# Patient Record
Sex: Male | Born: 1965 | Race: Black or African American | Hispanic: No | Marital: Married | Smoking: Never smoker
Health system: Southern US, Community
[De-identification: ages and names within clinical notes are randomized; demographics above are authoritative.]

## PROBLEM LIST (undated history)

## (undated) DIAGNOSIS — E119 Type 2 diabetes mellitus without complications: Secondary | ICD-10-CM

## (undated) DIAGNOSIS — I10 Essential (primary) hypertension: Secondary | ICD-10-CM

## (undated) DIAGNOSIS — E785 Hyperlipidemia, unspecified: Secondary | ICD-10-CM

## (undated) HISTORY — DX: Hyperlipidemia, unspecified: E78.5

## (undated) HISTORY — DX: Type 2 diabetes mellitus without complications: E11.9

## (undated) HISTORY — DX: Essential (primary) hypertension: I10

---

## 1997-12-11 ENCOUNTER — Emergency Department (HOSPITAL_COMMUNITY): Admission: EM | Admit: 1997-12-11 | Discharge: 1997-12-12 | Payer: Self-pay | Admitting: Emergency Medicine

## 1998-03-25 ENCOUNTER — Emergency Department (HOSPITAL_COMMUNITY): Admission: EM | Admit: 1998-03-25 | Discharge: 1998-03-25 | Payer: Self-pay | Admitting: Emergency Medicine

## 1998-05-28 ENCOUNTER — Emergency Department (HOSPITAL_COMMUNITY): Admission: EM | Admit: 1998-05-28 | Discharge: 1998-05-28 | Payer: Self-pay | Admitting: Emergency Medicine

## 2001-09-03 ENCOUNTER — Emergency Department (HOSPITAL_COMMUNITY): Admission: EM | Admit: 2001-09-03 | Discharge: 2001-09-03 | Payer: Self-pay | Admitting: Emergency Medicine

## 2002-01-11 ENCOUNTER — Emergency Department (HOSPITAL_COMMUNITY): Admission: EM | Admit: 2002-01-11 | Discharge: 2002-01-11 | Payer: Self-pay | Admitting: Emergency Medicine

## 2002-12-15 ENCOUNTER — Emergency Department (HOSPITAL_COMMUNITY): Admission: EM | Admit: 2002-12-15 | Discharge: 2002-12-15 | Payer: Self-pay | Admitting: Emergency Medicine

## 2011-07-26 ENCOUNTER — Emergency Department (HOSPITAL_COMMUNITY)
Admission: EM | Admit: 2011-07-26 | Discharge: 2011-07-26 | Disposition: A | Discharge status: Home / Self Care | Payer: Self-pay | Attending: Emergency Medicine | Admitting: Emergency Medicine

## 2011-07-26 ENCOUNTER — Other Ambulatory Visit: Payer: Self-pay

## 2011-07-26 ENCOUNTER — Encounter (HOSPITAL_COMMUNITY): Payer: Self-pay

## 2011-07-26 DIAGNOSIS — R002 Palpitations: Secondary | ICD-10-CM | POA: Insufficient documentation

## 2011-07-26 DIAGNOSIS — R197 Diarrhea, unspecified: Secondary | ICD-10-CM | POA: Insufficient documentation

## 2011-07-26 DIAGNOSIS — R55 Syncope and collapse: Secondary | ICD-10-CM | POA: Insufficient documentation

## 2011-07-26 DIAGNOSIS — J3489 Other specified disorders of nose and nasal sinuses: Secondary | ICD-10-CM | POA: Insufficient documentation

## 2011-07-26 DIAGNOSIS — R42 Dizziness and giddiness: Secondary | ICD-10-CM | POA: Insufficient documentation

## 2011-07-26 LAB — POCT I-STAT TROPONIN I

## 2011-07-26 LAB — POCT I-STAT, CHEM 8
BUN: 15 mg/dL (ref 6–23)
Creatinine, Ser: 1 mg/dL (ref 0.50–1.35)
Glucose, Bld: 89 mg/dL (ref 70–99)
Potassium: 5.2 mEq/L — ABNORMAL HIGH (ref 3.5–5.1)
Sodium: 139 mEq/L (ref 135–145)

## 2011-07-26 NOTE — Discharge Instructions (Signed)
Near-Syncope °Near-syncope is sudden weakness, dizziness, or feeling like you might pass out (faint). This may occur when getting up after sitting or while standing for a long period of time. Near-syncope can be caused by a drop in blood pressure. This is a common reaction, but it may occur to a greater degree in people taking medicines to control their blood pressure. Fainting often occurs when the blood pressure or pulse is too low to provide enough blood flow to the brain to keep you conscious. Fainting and near-syncope are not usually due to serious medical problems. However, certain people should be more cautious in the event of near-syncope, including elderly patients, patients with diabetes, and patients with a history of heart conditions (especially irregular rhythms).  °CAUSES  °· Drop in blood pressure.  °· Physical pain.  °· Dehydration.  °· Heat exhaustion.  °· Emotional distress.  °· Low blood sugar.  °· Internal bleeding.  °· Heart and circulatory problems.  °· Infections.  °SYMPTOMS  °· Dizziness.  °· Feeling sick to your stomach (nauseous).  °· Nearly fainting.  °· Body numbness.  °· Turning pale.  °· Tunnel vision.  °· Weakness.  °HOME CARE INSTRUCTIONS  °· Lie down right away if you start feeling like you might faint. Breathe deeply and steadily. Wait until all the symptoms have passed. Most of these episodes last only a few minutes. You may feel tired for several hours.  °· Drink enough fluids to keep your urine clear or pale yellow.  °· If you are taking blood pressure or heart medicine, get up slowly, taking several minutes to sit and then stand. This can reduce dizziness that is caused by a drop in blood pressure.  °SEEK IMMEDIATE MEDICAL CARE IF:  °· You have a severe headache.  °· Unusual pain develops in the chest, abdomen, or back.  °· There is bleeding from the mouth or rectum, or you have black or tarry stool.  °· An irregular heartbeat or a very rapid pulse develops.  °· You have  repeated fainting or seizure-like jerking during an episode.  °· You faint when sitting or lying down.  °· You develop confusion.  °· You have difficulty walking.  °· Severe weakness develops.  °· Vision problems develop.  °MAKE SURE YOU:  °· Understand these instructions.  °· Will watch your condition.  °· Will get help right away if you are not doing well or get worse.  °Document Released: 05/15/2005 Document Revised: 01/25/2011 Document Reviewed: 07/01/2010 °[employer]® Patient Information ©2012 [employer]. °

## 2011-07-26 NOTE — ED Notes (Signed)
Per EMS- Patient reported that he became dizzy at 1100 this AM and had soreness of his lower abdomen. Then at 1200 patient reports that he had a BM, felt like his heart was racing and then felt like he was going to pass out, but did not. Patient states he had a similar episode 15 years ago when he had a panic attack.

## 2011-07-26 NOTE — ED Notes (Signed)
NWG:[id number]<ZH> Expected date:07/26/11<BR> Expected time:12:55 PM<BR> Means of arrival:Ambulance<BR> Comments:<BR> Near syncope

## 2011-07-26 NOTE — ED Provider Notes (Signed)
History     CSN: [id number]  Arrival date & time 07/26/11  1304   First MD Initiated Contact with Patient 07/26/11 1324      Chief Complaint  Patient presents with  . Near Syncope    HPI Patient says that he was feeling strange this morning.  He works packing boxes.  He said he had some sudden crampy abdominal pain, and had to to to the bathroom and had some diarrhea.  He says after that he went back to his work, and he was feeling light headed, and nearly passed out.  He felt his heart race with this, but no chest pain or dyspnea.  He did not pass out but had to sit down, and EMS was called.   The patient notes he has been a little under the weather lately, with a cold, but no fevers and chills. He says he has been drinking a lot of soda and doe not drink much water. He says he has not medical problems, and has not seen a doctor in years.  He says his mother has high blood pressure, but no one in the family has heart problems.   History reviewed. No pertinent past medical history.  History reviewed. No pertinent past surgical history.  Family History  Problem Relation Age of Onset  . Hypertension Mother     History  Substance Use Topics  . Smoking status: Never Smoker   . Smokeless tobacco: Never Used  . Alcohol Use: Yes     occasionally      Review of Systems  Constitutional: Negative for fever.  HENT: Positive for rhinorrhea.   Eyes: Negative for visual disturbance.  Respiratory: Negative for shortness of breath.   Cardiovascular: Positive for palpitations. Negative for chest pain.  Gastrointestinal: Positive for diarrhea.  Genitourinary: Negative for dysuria.  Musculoskeletal: Negative for myalgias.  Skin: Negative for rash.  Neurological: Positive for light-headedness. Negative for syncope.  Hematological: Negative for adenopathy.    Allergies  Review of patient's allergies indicates no known allergies.  Home Medications  No current outpatient prescriptions  on file.  BP 151/91  Pulse 71  Temp(Src) 98.5 F (36.9 C) (Oral)  Resp 18  SpO2 100%  Physical Exam  Constitutional: He is oriented to person, place, and time. He appears well-developed and well-nourished. No distress.  HENT:  Head: Normocephalic and atraumatic.  Eyes: EOM are normal. Pupils are equal, round, and reactive to light.  Neck: Normal range of motion. Neck supple. No JVD present. Carotid bruit is not present. No thyromegaly present.  Cardiovascular: Normal rate and regular rhythm.   Pulmonary/Chest: Effort normal and breath sounds normal.  Abdominal: Soft. Bowel sounds are normal. There is no tenderness.  Musculoskeletal: He exhibits no edema.  Neurological: He is alert and oriented to person, place, and time. No cranial nerve deficit.  Skin: Skin is warm and dry. No rash noted.    ED Course  Procedures (including critical care time)  Labs Reviewed  POCT I-STAT, CHEM 8 - Abnormal; Notable for the following:    Potassium 5.2 (*)    All other components within normal limits  POCT I-STAT TROPONIN I   No results found.   1. Vasovagal near syncope       MDM  Feel this was due to Vasovagal and/or dehydration.  Pt advised of diagnosis.   Advised to establish care with a primary doctor due to elevated blood pressure in the ED.  Ardyth Gal, MD 07/26/11 [phone]

## 2011-07-27 NOTE — ED Provider Notes (Signed)
I saw and evaluated the patient, reviewed the resident's note and I agree with the findings and plan.   .Face to face Exam:  General:  Awake HEENT:  Atraumatic Resp:  Normal effort Abd:  Nondistended Neuro:No focal weakness Lymph: No adenopathy   Nelia Shi, MD 07/27/11 [phone]

## 2015-01-16 ENCOUNTER — Emergency Department (HOSPITAL_COMMUNITY)
Admission: EM | Admit: 2015-01-16 | Discharge: 2015-01-17 | Disposition: A | Discharge status: Home / Self Care | Payer: No Typology Code available for payment source | Attending: Emergency Medicine | Admitting: Emergency Medicine

## 2015-01-16 DIAGNOSIS — S40812A Abrasion of left upper arm, initial encounter: Secondary | ICD-10-CM | POA: Diagnosis not present

## 2015-01-16 DIAGNOSIS — Y9389 Activity, other specified: Secondary | ICD-10-CM | POA: Insufficient documentation

## 2015-01-16 DIAGNOSIS — S0101XA Laceration without foreign body of scalp, initial encounter: Secondary | ICD-10-CM | POA: Insufficient documentation

## 2015-01-16 DIAGNOSIS — Y999 Unspecified external cause status: Secondary | ICD-10-CM | POA: Insufficient documentation

## 2015-01-16 DIAGNOSIS — Y92410 Unspecified street and highway as the place of occurrence of the external cause: Secondary | ICD-10-CM | POA: Insufficient documentation

## 2015-01-16 DIAGNOSIS — S01411A Laceration without foreign body of right cheek and temporomandibular area, initial encounter: Secondary | ICD-10-CM | POA: Insufficient documentation

## 2015-01-16 DIAGNOSIS — Z23 Encounter for immunization: Secondary | ICD-10-CM | POA: Diagnosis not present

## 2015-01-16 DIAGNOSIS — S01412A Laceration without foreign body of left cheek and temporomandibular area, initial encounter: Secondary | ICD-10-CM | POA: Insufficient documentation

## 2015-01-16 DIAGNOSIS — S0993XA Unspecified injury of face, initial encounter: Secondary | ICD-10-CM | POA: Diagnosis present

## 2015-01-16 DIAGNOSIS — S0181XA Laceration without foreign body of other part of head, initial encounter: Secondary | ICD-10-CM

## 2015-01-16 MED ORDER — TETANUS-DIPHTH-ACELL PERTUSSIS 5-2.5-18.5 LF-MCG/0.5 IM SUSP
0.5000 mL | Freq: Once | INTRAMUSCULAR | Status: AC
  Administered 2015-01-17: 0.5 mL via INTRAMUSCULAR
  Filled 2015-01-16: qty 0.5

## 2015-01-16 MED ORDER — CEFAZOLIN SODIUM 1-5 GM-% IV SOLN
1.0000 g | Freq: Once | INTRAVENOUS | Status: AC
  Administered 2015-01-17: 1 g via INTRAVENOUS
  Filled 2015-01-16: qty 50
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
## 2015-01-17 ENCOUNTER — Emergency Department (HOSPITAL_COMMUNITY): Payer: No Typology Code available for payment source

## 2015-01-17 ENCOUNTER — Encounter (HOSPITAL_COMMUNITY): Payer: Self-pay | Admitting: *Deleted

## 2015-01-17 DIAGNOSIS — S01412A Laceration without foreign body of left cheek and temporomandibular area, initial encounter: Secondary | ICD-10-CM | POA: Diagnosis not present

## 2015-01-17 LAB — CBC WITH DIFFERENTIAL/PLATELET
Basophils Absolute: 0 10*3/uL (ref 0.0–0.1)
Basophils Relative: 0 % (ref 0–1)
EOS ABS: 0.1 10*3/uL (ref 0.0–0.7)
EOS PCT: 2 % (ref 0–5)
HCT: 42.5 % (ref 39.0–52.0)
Hemoglobin: 14.4 g/dL (ref 13.0–17.0)
LYMPHS ABS: 2.7 10*3/uL (ref 0.7–4.0)
Lymphocytes Relative: 52 % — ABNORMAL HIGH (ref 12–46)
MCH: 28 pg (ref 26.0–34.0)
MCHC: 33.9 g/dL (ref 30.0–36.0)
MCV: 82.7 fL (ref 78.0–100.0)
MONO ABS: 0.5 10*3/uL (ref 0.1–1.0)
MONOS PCT: 8 % (ref 3–12)
Neutro Abs: 2 10*3/uL (ref 1.7–7.7)
Neutrophils Relative %: 38 % — ABNORMAL LOW (ref 43–77)
PLATELETS: 227 10*3/uL (ref 150–400)
RBC: 5.14 MIL/uL (ref 4.22–5.81)
RDW: 14.6 % (ref 11.5–15.5)
WBC: 5.3 10*3/uL (ref 4.0–10.5)

## 2015-01-17 LAB — I-STAT CHEM 8, ED
BUN: 15 mg/dL (ref 6–20)
CALCIUM ION: 1.12 mmol/L (ref 1.12–1.23)
Chloride: 103 mmol/L (ref 101–111)
Creatinine, Ser: 1.2 mg/dL (ref 0.61–1.24)
GLUCOSE: 95 mg/dL (ref 65–99)
HCT: 47 % (ref 39.0–52.0)
HEMOGLOBIN: 16 g/dL (ref 13.0–17.0)
Potassium: 4.1 mmol/L (ref 3.5–5.1)
SODIUM: 140 mmol/L (ref 135–145)
TCO2: 23 mmol/L (ref 0–100)

## 2015-01-17 LAB — RAPID URINE DRUG SCREEN, HOSP PERFORMED
Amphetamines: NOT DETECTED
Barbiturates: NOT DETECTED
Benzodiazepines: NOT DETECTED
COCAINE: NOT DETECTED
OPIATES: NOT DETECTED
Tetrahydrocannabinol: NOT DETECTED

## 2015-01-17 MED ORDER — OXYCODONE-ACETAMINOPHEN 5-325 MG PO TABS
1.0000 | ORAL_TABLET | Freq: Once | ORAL | Status: AC
  Administered 2015-01-17: 1 via ORAL
  Filled 2015-01-17: qty 1

## 2015-01-17 MED ORDER — IOHEXOL 300 MG/ML  SOLN
100.0000 mL | Freq: Once | INTRAMUSCULAR | Status: AC | PRN
  Administered 2015-01-17: 100 mL via INTRAVENOUS

## 2015-01-17 MED ORDER — KETOROLAC TROMETHAMINE 30 MG/ML IJ SOLN
30.0000 mg | Freq: Once | INTRAMUSCULAR | Status: AC
  Administered 2015-01-17: 30 mg via INTRAVENOUS
  Filled 2015-01-17: qty 1

## 2015-01-17 MED ORDER — MELOXICAM 15 MG PO TABS: 15.0000 mg | ORAL_TABLET | Freq: Every day | ORAL | Status: DC

## 2015-01-17 MED ORDER — SODIUM CHLORIDE 0.9 % IV SOLN
Freq: Once | INTRAVENOUS | Status: AC
  Administered 2015-01-17: 01:00:00 via INTRAVENOUS

## 2015-01-17 NOTE — ED Notes (Signed)
EDP at bedside  
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
## 2015-01-17 NOTE — ED Notes (Signed)
Pt taken to CT.

## 2015-01-17 NOTE — Discharge Instructions (Signed)
Facial Laceration °A facial laceration is a cut on the face. These injuries can be painful and cause bleeding. Some cuts may need to be closed with stitches (sutures), skin adhesive strips, or wound glue. Cuts usually heal quickly but can leave a scar. It can take 1-2 years for the scar to go away completely. °HOME CARE  °· Only take medicines as told by your doctor. °· Follow your doctor's instructions for wound care. °For Stitches: °· Keep the cut clean and dry. °· If you have a bandage (dressing), change it at least once a day. Change the bandage if it gets wet or dirty, or as told by your doctor. °· Wash the cut with soap and water 2 times a day. Rinse the cut with water. Pat it dry with a clean towel. °· Put a thin layer of medicated cream on the cut as told by your doctor. °· You may shower after the first 24 hours. Do not soak the cut in water until the stitches are removed. °· Have your stitches removed as told by your doctor. °· Do not wear any makeup until a few days after your stitches are removed. °For Skin Adhesive Strips: °· Keep the cut clean and dry. °· Do not get the strips wet. You may take a bath, but be careful to keep the cut dry. °· If the cut gets wet, pat it dry with a clean towel. °· The strips will fall off on their own. Do not remove the strips that are still stuck to the cut. °For Wound Glue: °· You may shower or take baths. Do not soak or scrub the cut. Do not swim. Avoid heavy sweating until the glue falls off on its own. After a shower or bath, pat the cut dry with a clean towel. °· Do not put medicine or makeup on your cut until the glue falls off. °· If you have a bandage, do not put tape over the glue. °· Avoid lots of sunlight or tanning lamps until the glue falls off. °· The glue will fall off on its own in 5-10 days. Do not pick at the glue. °After Healing: °Put sunscreen on the cut for the first year to reduce your scar. °GET HELP RIGHT AWAY IF:  °· Your cut area gets red,  painful, or puffy (swollen). °· You see a yellowish-white fluid (pus) coming from the cut. °· You have chills or a fever. °MAKE SURE YOU:  °· Understand these instructions. °· Will watch your condition. °· Will get help right away if you are not doing well or get worse. °Document Released: 11/01/2007 Document Revised: 03/05/2013 Document Reviewed: 12/26/2012 °ExitCare® Patient Information ©2015 [employer]. This information is not intended to replace advice given to you by your health care provider. Make sure you discuss any questions you have with your health care provider. ° °

## 2015-01-17 NOTE — ED Notes (Signed)
Wounds to L elbow cleaned with sur cleanse by Yvone Neu, EMT; elbow wrapped

## 2016-02-15 ENCOUNTER — Encounter (HOSPITAL_COMMUNITY): Payer: Self-pay | Admitting: Emergency Medicine

## 2016-02-15 ENCOUNTER — Ambulatory Visit (HOSPITAL_COMMUNITY)
Admission: EM | Admit: 2016-02-15 | Discharge: 2016-02-15 | Disposition: A | Discharge status: Home / Self Care | Payer: BLUE CROSS/BLUE SHIELD | Attending: Internal Medicine | Admitting: Internal Medicine

## 2016-02-15 DIAGNOSIS — T148 Other injury of unspecified body region: Secondary | ICD-10-CM

## 2016-02-15 DIAGNOSIS — W57XXXA Bitten or stung by nonvenomous insect and other nonvenomous arthropods, initial encounter: Secondary | ICD-10-CM

## 2016-02-15 MED ORDER — FAMOTIDINE 40 MG PO TABS: 40.0000 mg | ORAL_TABLET | Freq: Every day | ORAL | 0 refills | Status: DC

## 2016-02-15 MED ORDER — PREDNISONE 50 MG PO TABS: 50.0000 mg | ORAL_TABLET | Freq: Every day | ORAL | 0 refills | Status: DC

## 2016-02-15 NOTE — ED Triage Notes (Signed)
Patient has random bumps to neck and arms, various sizes.  Started Saturday.  Patient reports feeling nauseated.

## 2016-02-15 NOTE — ED Provider Notes (Signed)
[place] [facility]    CSN: [id number] Arrival date & time: 02/15/16  1010  First Provider Contact:  First MD Initiated Contact with Patient 02/15/16 1135        History   Chief Complaint Chief Complaint  Patient presents with  . Insect Bite    HPI Daily Theodore Rodriguez is a 50 y.o. male. presents today with numerous itchy papules, over back of neck, R anterior thigh, bilat arms, after sitting on a family member's couch for a few hours on 9/16.  Scratching keeping him awake at night x 2 nights, a little nausea today.  Feels ok otherwise.  No fever, no malaise.  No other itchy family members.  Owner of couch has a dog who sits on the couch too.     HPI  History reviewed. No pertinent past medical history.  History reviewed. No pertinent surgical history.     Home Medications         Takes no meds regularly  Family History Family History  Problem Relation Age of Onset  . Hypertension Mother     Social History Social History  Substance Use Topics  . Smoking status: Never Smoker  . Smokeless tobacco: Never Used  . Alcohol use Yes     Comment: occasionally     Allergies   Review of patient's allergies indicates no known allergies.   Review of Systems Review of Systems  All other systems reviewed and are negative.    Physical Exam Triage Vital Signs ED Triage Vitals [02/15/16 1108]  Enc Vitals Group     BP (!) 180/114     Pulse Rate 70     Resp 16     Temp 98 F (36.7 C)     Temp Source Oral     SpO2 99 %     Weight      Height    Updated Vital Signs BP (!) 180/114 (BP Location: Left Arm)   Pulse 70   Temp 98 F (36.7 C) (Oral)   Resp 16   SpO2 99%  Physical Exam  Constitutional: He is oriented to person, place, and time. No distress.  Alert, nicely groomed  HENT:  Head: Atraumatic.  Eyes:  Conjugate gaze, no eye redness/drainage  Neck: Neck supple.  Cardiovascular: Normal rate.   Pulmonary/Chest: No respiratory distress.  Abdominal: He  exhibits no distension.  Musculoskeletal: Normal range of motion.  Neurological: He is alert and oriented to person, place, and time.  Skin: Skin is warm and dry.  No cyanosis Numerous 8 mm papules, some slightly red, over back of head/neck, undersurfaces of both upper arms, (by report) anterior right thigh.  No blisters/erosions/crusts.  No facial involvement.    Nursing note and vitals reviewed.    UC Treatments / Results   Procedures Procedures (including critical care time)      None  Initial Impression / Assessment and Plan / UC Course   Differential dx: bed bugs, scabies, flea bites, viral exanthem, (less likely) stevens johnson, urticaria.    Final Clinical Impressions(s) / UC Diagnoses   Final diagnoses:  Insect bites   Recheck for further evaluation if bites/itching are not improving in 5-10 days, or if some bites become blistery/raw or involve your mouth/eyes/genitals or for new fever >100.5.  Prescriptions for prednisone (for itching) and famotidine (for nausea) were sent to the [facility] on [address].   New Prescriptions New Prescriptions   FAMOTIDINE (PEPCID) 40 MG TABLET    Take 1 tablet (  40 mg total) by mouth daily.   PREDNISONE (DELTASONE) 50 MG TABLET    Take 1 tablet (50 mg total) by mouth daily.     Sherlene Shams, MD 02/17/16 [phone]

## 2016-02-15 NOTE — Discharge Instructions (Addendum)
Recheck for further evaluation if bites/itching are not improving in 5-10 days, or if some bites become blistery/raw or involve your mouth/eyes/genitals or for new fever >100.5.  Prescriptions for prednisone (for itching) and famotidine (for nausea) were sent to the [facility] on [address].

## 2017-02-21 ENCOUNTER — Ambulatory Visit: Payer: BLUE CROSS/BLUE SHIELD | Admitting: Podiatry

## 2017-04-25 ENCOUNTER — Other Ambulatory Visit: Payer: Self-pay

## 2017-04-25 ENCOUNTER — Ambulatory Visit (INDEPENDENT_AMBULATORY_CARE_PROVIDER_SITE_OTHER): Payer: BLUE CROSS/BLUE SHIELD | Admitting: Family Medicine

## 2017-04-25 ENCOUNTER — Encounter: Payer: Self-pay | Admitting: Family Medicine

## 2017-04-25 VITALS — BP 184/114 | HR 82 | Temp 98.4°F | Resp 18 | Ht 66.42 in | Wt 150.2 lb

## 2017-04-25 DIAGNOSIS — I10 Essential (primary) hypertension: Secondary | ICD-10-CM

## 2017-04-25 DIAGNOSIS — Z131 Encounter for screening for diabetes mellitus: Secondary | ICD-10-CM | POA: Diagnosis not present

## 2017-04-25 MED ORDER — VALSARTAN-HYDROCHLOROTHIAZIDE 160-25 MG PO TABS: 1.0000 | ORAL_TABLET | Freq: Every day | ORAL | 1 refills | Status: DC

## 2017-04-25 NOTE — Patient Instructions (Addendum)
Start a new medication called Valsartan-hctz in the morning Avoid salty foods and sauces Cut back on alcohol Increase your exercise to 15-20 minutes daily Stop the LOSARTAN that you have at home Follow up in 2 weeks for blood pressure visit  IF you received an x-ray today, you will receive an invoice from [facility]. Please contact [facility] at [phone] with questions or concerns regarding your invoice.   IF you received labwork today, you will receive an invoice from [facility]. Please contact [facility] at [phone] with questions or concerns regarding your invoice.   Our billing staff will not be able to assist you with questions regarding bills from these companies.  You will be contacted with the lab results as soon as they are available. The fastest way to get your results is to activate your My Chart account. Instructions are located on the last page of this paperwork. If you have not heard from [place] regarding the results in 2 weeks, please contact this office.     DASH Eating Plan DASH stands for "Dietary Approaches to Stop Hypertension." The DASH eating plan is a healthy eating plan that has been shown to reduce high blood pressure (hypertension). It may also reduce your risk for type 2 diabetes, heart disease, and stroke. The DASH eating plan may also help with weight loss. What are tips for following this plan? General guidelines  Avoid eating more than 2,300 mg (milligrams) of salt (sodium) a day. If you have hypertension, you may need to reduce your sodium intake to 1,500 mg a day.  Limit alcohol intake to no more than 1 drink a day for nonpregnant women and 2 drinks a day for men. One drink equals 12 oz of beer, 5 oz of wine, or 1 oz of hard liquor.  Work with your health care provider to maintain a healthy body weight or to lose weight. Ask what an ideal weight is for you.  Get at least 30 minutes of exercise that causes your heart to beat faster  (aerobic exercise) most days of the week. Activities may include walking, swimming, or biking.  Work with your health care provider or diet and nutrition specialist (dietitian) to adjust your eating plan to your individual calorie needs. Reading food labels  Check food labels for the amount of sodium per serving. Choose foods with less than 5 percent of the Daily Value of sodium. Generally, foods with less than 300 mg of sodium per serving fit into this eating plan.  To find whole grains, look for the word "whole" as the first word in the ingredient list. Shopping  Buy products labeled as "low-sodium" or "no salt added."  Buy fresh foods. Avoid canned foods and premade or frozen meals. Cooking  Avoid adding salt when cooking. Use salt-free seasonings or herbs instead of table salt or sea salt. Check with your health care provider or pharmacist before using salt substitutes.  Do not fry foods. Cook foods using healthy methods such as baking, boiling, grilling, and broiling instead.  Cook with heart-healthy oils, such as olive, canola, soybean, or sunflower oil. Meal planning   Eat a balanced diet that includes: ? 5 or more servings of fruits and vegetables each day. At each meal, try to fill half of your plate with fruits and vegetables. ? Up to 6-8 servings of whole grains each day. ? Less than 6 oz of lean meat, poultry, or fish each day. A 3-oz serving of meat is about the same size as  a deck of cards. One egg equals 1 oz. ? 2 servings of low-fat dairy each day. ? A serving of nuts, seeds, or beans 5 times each week. ? Heart-healthy fats. Healthy fats called Omega-3 fatty acids are found in foods such as flaxseeds and coldwater fish, like sardines, salmon, and mackerel.  Limit how much you eat of the following: ? Canned or prepackaged foods. ? Food that is high in trans fat, such as fried foods. ? Food that is high in saturated fat, such as fatty meat. ? Sweets, desserts, sugary  drinks, and other foods with added sugar. ? Full-fat dairy products.  Do not salt foods before eating.  Try to eat at least 2 vegetarian meals each week.  Eat more home-cooked food and less restaurant, buffet, and fast food.  When eating at a restaurant, ask that your food be prepared with less salt or no salt, if possible. What foods are recommended? The items listed may not be a complete list. Talk with your dietitian about what dietary choices are best for you. Grains Whole-grain or whole-wheat bread. Whole-grain or whole-wheat pasta. Brown rice. [place]. Bulgur. Whole-grain and low-sodium cereals. Pita bread. Low-fat, low-sodium crackers. Whole-wheat flour tortillas. Vegetables Fresh or frozen vegetables (raw, steamed, roasted, or grilled). Low-sodium or reduced-sodium tomato and vegetable juice. Low-sodium or reduced-sodium tomato sauce and tomato paste. Low-sodium or reduced-sodium canned vegetables. Fruits All fresh, dried, or frozen fruit. Canned fruit in natural juice (without added sugar). Meat and other protein foods Skinless chicken or [place]. Ground chicken or [place]. Pork with fat trimmed off. Fish and seafood. Egg whites. Dried beans, peas, or lentils. Unsalted nuts, nut butters, and seeds. Unsalted canned beans. Lean cuts of beef with fat trimmed off. Low-sodium, lean deli meat. Dairy Low-fat (1%) or fat-free (skim) milk. Fat-free, low-fat, or reduced-fat cheeses. Nonfat, low-sodium ricotta or cottage cheese. Low-fat or nonfat yogurt. Low-fat, low-sodium cheese. Fats and oils Soft margarine without trans fats. Vegetable oil. Low-fat, reduced-fat, or light mayonnaise and salad dressings (reduced-sodium). Canola, safflower, olive, soybean, and sunflower oils. Avocado. Seasoning and other foods Herbs. Spices. Seasoning mixes without salt. Unsalted popcorn and pretzels. Fat-free sweets. What foods are not recommended? The items listed may not be a complete list. Talk  with your dietitian about what dietary choices are best for you. Grains Baked goods made with fat, such as croissants, muffins, or some breads. Dry pasta or rice meal packs. Vegetables Creamed or fried vegetables. Vegetables in a cheese sauce. Regular canned vegetables (not low-sodium or reduced-sodium). Regular canned tomato sauce and paste (not low-sodium or reduced-sodium). Regular tomato and vegetable juice (not low-sodium or reduced-sodium). Angie Fava. Olives. Fruits Canned fruit in a light or heavy syrup. Fried fruit. Fruit in cream or butter sauce. Meat and other protein foods Fatty cuts of meat. Ribs. Fried meat. Berniece Salines. Sausage. Bologna and other processed lunch meats. Salami. Fatback. Hotdogs. Bratwurst. Salted nuts and seeds. Canned beans with added salt. Canned or smoked fish. Whole eggs or egg yolks. Chicken or [place] with skin. Dairy Whole or 2% milk, cream, and half-and-half. Whole or full-fat cream cheese. Whole-fat or sweetened yogurt. Full-fat cheese. Nondairy creamers. Whipped toppings. Processed cheese and cheese spreads. Fats and oils Butter. Stick margarine. Lard. Shortening. Ghee. Bacon fat. Tropical oils, such as coconut, palm kernel, or palm oil. Seasoning and other foods Salted popcorn and pretzels. Onion salt, garlic salt, seasoned salt, table salt, and sea salt. Worcestershire sauce. Tartar sauce. Barbecue sauce. Teriyaki sauce. Soy sauce, including reduced-sodium. Steak sauce.  Canned and packaged gravies. Fish sauce. Oyster sauce. Cocktail sauce. Horseradish that you find on the shelf. Ketchup. Mustard. Meat flavorings and tenderizers. Bouillon cubes. Hot sauce and Tabasco sauce. Premade or packaged marinades. Premade or packaged taco seasonings. Relishes. Regular salad dressings. Where to find more information:  National Heart, Lung, and [place]: https://wilson-eaton.com/  American Heart Association: www.heart.org Summary  The DASH eating plan is a healthy eating plan  that has been shown to reduce high blood pressure (hypertension). It may also reduce your risk for type 2 diabetes, heart disease, and stroke.  With the DASH eating plan, you should limit salt (sodium) intake to 2,300 mg a day. If you have hypertension, you may need to reduce your sodium intake to 1,500 mg a day.  When on the DASH eating plan, aim to eat more fresh fruits and vegetables, whole grains, lean proteins, low-fat dairy, and heart-healthy fats.  Work with your health care provider or diet and nutrition specialist (dietitian) to adjust your eating plan to your individual calorie needs. This information is not intended to replace advice given to you by your health care provider. Make sure you discuss any questions you have with your health care provider. Document Released: 05/04/2011 Document Revised: 05/08/2016 Document Reviewed: 05/08/2016 Elsevier Interactive Patient Education  2017 [employer].

## 2017-04-25 NOTE — Progress Notes (Signed)
Chief Complaint  Patient presents with  . Hypertension  . Dizziness    X 1 day  . Gas    X 1 day    HPI   Hypertension: Patient here for follow-up of elevated blood pressure. He is not exercising and is adherent to low salt diet.  Blood pressure is not well controlled at home. Cardiac symptoms none. Patient denies chest pain, fatigue, irregular heart beat, lower extremity edema and near-syncope.  Cardiovascular risk factors: hypertension and male gender. Use of agents associated with hypertension: none. History of target organ damage: none. BP Readings from Last 3 Encounters:  04/25/17 (!) 184/114  02/15/16 (!) 180/114  01/17/15 119/85    Pt reports that he gets some gas pain that has been moving around today He has not been eating He has not been drinking fluids He just resumed his losartan that he was prescribed 3 months ago by  His PCP at [facility] When he was prescribed 50mg  and increased to a 100mg   He only took the 50mg   He is a nonsmoker He drinks 3-4 beers per day after work   He reports that he is coming here from his close friends funeral who died of a stroke.  He has not eaten since yesterday and has some gas pains.    Past Medical History:  Diagnosis Date  . Hypertension     Current Outpatient Medications  Medication Sig Dispense Refill  . losartan (COZAAR) 50 MG tablet Take 50 mg by mouth daily.    . famotidine (PEPCID) 40 MG tablet Take 1 tablet (40 mg total) by mouth daily. (Patient not taking: Reported on 04/25/2017) 15 tablet 0  . predniSONE (DELTASONE) 50 MG tablet Take 1 tablet (50 mg total) by mouth daily. (Patient not taking: Reported on 04/25/2017) 3 tablet 0  . valsartan-hydrochlorothiazide (DIOVAN HCT) 160-25 MG tablet Take 1 tablet by mouth daily. 30 tablet 1   No current facility-administered medications for this visit.     Allergies: No Known Allergies  History reviewed. No pertinent surgical history.  Social History    Socioeconomic History  . Marital status: Married    Spouse name: None  . Number of children: None  . Years of education: None  . Highest education level: None  Social Needs  . Financial resource strain: None  . Food insecurity - worry: None  . Food insecurity - inability: None  . Transportation needs - medical: None  . Transportation needs - non-medical: None  Occupational History  . None  Tobacco Use  . Smoking status: Never Smoker  . Smokeless tobacco: Never Used  Substance and Sexual Activity  . Alcohol use: Yes    Comment: occasionally  . Drug use: No  . Sexual activity: None  Other Topics Concern  . None  Social History Narrative  . None    Family History  Problem Relation Age of Onset  . Hypertension Mother      ROS Review of Systems See HPI Constitution: No fevers or chills No malaise No diaphoresis Skin: No rash or itching Eyes: no blurry vision, no double vision GU: no dysuria or hematuria Neuro: no dizziness or headaches * all others reviewed and negative   Objective: Vitals:   04/25/17 1631 04/25/17 1725  BP: (!) 191/117 (!) 184/114  Pulse: 82   Resp: 18   Temp: 98.4 F (36.9 C)   TempSrc: Oral   SpO2: 99%   Weight: 150 lb 3.2 oz (68.1 kg)   Height:  5' 6.42" (1.687 m)     Physical Exam  Constitutional: He is oriented to person, place, and time. He appears well-developed and well-nourished.  HENT:  Head: Normocephalic and atraumatic.  Eyes: Conjunctivae and EOM are normal.  Neck: Normal range of motion. No thyromegaly present.  Cardiovascular: Normal rate, regular rhythm and normal heart sounds.  No murmur heard. Pulmonary/Chest: Effort normal and breath sounds normal. No respiratory distress.  Abdominal: Soft. Bowel sounds are normal. He exhibits no distension and no mass. There is no tenderness. There is no guarding.  Neurological: He is alert and oriented to person, place, and time.  Skin: Skin is warm. Capillary refill takes  less than 2 seconds.  Psychiatric: He has a normal mood and affect. His behavior is normal. Judgment and thought content normal.    ECG 04/25/17 LVH criteria as well as LAE No ST elevation MI Compared to 07/26/11 ECG unchanged.   Assessment and Plan Dewell was seen today for hypertension, dizziness and gas.  Diagnoses and all orders for this visit:  Uncontrolled hypertension- bp uncontrolled, increased dose of med from losartan 50mg  to diovan hctz 160-25mg  Will refer for stress testing Close follow up in 2 weeks Currently asymptomatic -     Comprehensive metabolic panel -     Lipid panel -     TSH -     valsartan-hydrochlorothiazide (DIOVAN HCT) 160-25 MG tablet; Take 1 tablet by mouth daily. -     Exercise Tolerance Test; Future  Hypertension, unspecified type -     EKG 12-Lead -     Comprehensive metabolic panel -     Lipid panel -     TSH  Screening for diabetes mellitus -     Hemoglobin A1c   A total of 25 minutes were spent face-to-face with the patient during this encounter and over half of that time was spent on counseling and coordination of care.   [place]

## 2017-04-26 ENCOUNTER — Telehealth: Payer: Self-pay | Admitting: Family Medicine

## 2017-04-26 LAB — COMPREHENSIVE METABOLIC PANEL
A/G RATIO: 1.7 (ref 1.2–2.2)
ALBUMIN: 4.5 g/dL (ref 3.5–5.5)
ALT: 17 IU/L (ref 0–44)
AST: 20 IU/L (ref 0–40)
Alkaline Phosphatase: 69 IU/L (ref 39–117)
BUN / CREAT RATIO: 9 (ref 9–20)
BUN: 9 mg/dL (ref 6–24)
Bilirubin Total: 1.4 mg/dL — ABNORMAL HIGH (ref 0.0–1.2)
CALCIUM: 9.5 mg/dL (ref 8.7–10.2)
CO2: 26 mmol/L (ref 20–29)
CREATININE: 0.99 mg/dL (ref 0.76–1.27)
Chloride: 99 mmol/L (ref 96–106)
GFR, EST AFRICAN AMERICAN: 101 mL/min/{1.73_m2} (ref >59)
GFR, EST NON AFRICAN AMERICAN: 87 mL/min/{1.73_m2} (ref >59)
GLOBULIN, TOTAL: 2.7 g/dL (ref 1.5–4.5)
Glucose: 105 mg/dL — ABNORMAL HIGH (ref 65–99)
POTASSIUM: 4.5 mmol/L (ref 3.5–5.2)
SODIUM: 140 mmol/L (ref 134–144)
Total Protein: 7.2 g/dL (ref 6.0–8.5)

## 2017-04-26 LAB — HEMOGLOBIN A1C
ESTIMATED AVERAGE GLUCOSE: 123 mg/dL
HEMOGLOBIN A1C: 5.9 % — AB (ref 4.8–5.6)

## 2017-04-26 LAB — LIPID PANEL
CHOL/HDL RATIO: 2.3 ratio (ref 0.0–5.0)
Cholesterol, Total: 175 mg/dL (ref 100–199)
HDL: 75 mg/dL (ref >39)
LDL CALC: 89 mg/dL (ref 0–99)
Triglycerides: 55 mg/dL (ref 0–149)
VLDL Cholesterol Cal: 11 mg/dL (ref 5–40)

## 2017-04-26 LAB — TSH: TSH: 0.708 u[IU]/mL (ref 0.450–4.500)

## 2017-04-26 NOTE — Telephone Encounter (Signed)
Copied from [place] [id number]. Topic: Quick Communication - See Telephone Encounter >> Apr 26, 2017  9:34 AM Theodore Rodriguez B wrote: CRM for notification. See Telephone encounter for:  Asking if he should take 1 or two blood pressure pills  pt was prescribed med yesterday  - valsarten hydrochlorothiazide 04/26/17. Pt states his bp is 147/96

## 2017-04-26 NOTE — Telephone Encounter (Signed)
LMOVM.  Stressed pt is to take only Valsartan-HCTZ in the am,  Should NOT take the Losartan. Limit salt, sauces,  Limit beers to 1-2 daily and walk 15-20 min daily-park at far end of parking lot when he goes somewhere and that will assist with his 15-20 min per day walking.

## 2017-05-08 NOTE — Progress Notes (Signed)
Chief Complaint  Patient presents with  . Follow-up    hypertension    HPI   Essential Hypertension  Hypertension: Patient here for follow-up of elevated blood pressure.  He was given Diovan Hctz 160-25mg  at his last visit on 04/25/2017.  He was started on the new dose to help control his bp and sent for stress test but the stress test was not appropriately ordered.     He is exercising and is trying adhere to low salt diet.   Blood pressure is well controlled at home. He has cut down on his alcohol consumption and is now only consuming 1-2 beers. Cardiac symptoms none.  Patient denies chest pain, chest pressure/discomfort, claudication, exertional chest pressure/discomfort, irregular heart beat, lower extremity edema and palpitations.   Cardiovascular risk factors: hypertension, male gender and sedentary lifestyle.  Use of agents associated with hypertension: none.  History of target organ damage: none. BP Readings from Last 3 Encounters:  05/09/17 130/74  04/25/17 (!) 184/114  02/15/16 (!) 180/114    Wt Readings from Last 3 Encounters:  05/09/17 150 lb 6.4 oz (68.2 kg)  04/25/17 150 lb 3.2 oz (68.1 kg)  01/17/15 155 lb (70.3 kg)   The 10-year ASCVD risk score Mikey Bussing DC Jr., et al., 2013) is: 8.5%   Values used to calculate the score:     Age: 51 years     Sex: Male     Is Non-Hispanic African American: Yes     Diabetic: No     Tobacco smoker: No     Systolic Blood Pressure: 086 mmHg     Is BP treated: Yes     HDL Cholesterol: 75 mg/dL     Total Cholesterol: 175 mg/dL  Prediabetes Pt reports that he has been having a better diet and eating less fast food He has cut back on alcohol Lab Results  Component Value Date   HGBA1C 5.9 (H) 04/25/2017   There is diabetes in his family in his mother and her side of the family. He denies polyuria, polydipsia and polyphagia.   Past Medical History:  Diagnosis Date  . Hypertension     Current Outpatient Medications    Medication Sig Dispense Refill  . valsartan-hydrochlorothiazide (DIOVAN HCT) 160-25 MG tablet Take 1 tablet by mouth daily. 90 tablet 1   No current facility-administered medications for this visit.     Allergies: No Known Allergies  No past surgical history on file.  Social History   Socioeconomic History  . Marital status: Married    Spouse name: None  . Number of children: None  . Years of education: None  . Highest education level: None  Social Needs  . Financial resource strain: None  . Food insecurity - worry: None  . Food insecurity - inability: None  . Transportation needs - medical: None  . Transportation needs - non-medical: None  Occupational History  . None  Tobacco Use  . Smoking status: Never Smoker  . Smokeless tobacco: Never Used  Substance and Sexual Activity  . Alcohol use: Yes    Comment: occasionally  . Drug use: No  . Sexual activity: None  Other Topics Concern  . None  Social History Narrative  . None    Family History  Problem Relation Age of Onset  . Hypertension Mother      ROS Review of Systems See HPI Constitution: No fevers or chills No malaise No diaphoresis Skin: No rash or itching Eyes: no blurry vision, no double vision  GU: no dysuria or hematuria Neuro: no dizziness or headaches all others reviewed and negative   Objective: Vitals:   05/09/17 1040  BP: 130/74  Pulse: 94  Resp: 16  Temp: 98 F (36.7 C)  TempSrc: Oral  SpO2: 96%  Weight: 150 lb 6.4 oz (68.2 kg)  Height: 5' 6.42" (1.687 m)    Physical Exam  Physical Exam  Constitutional: She is oriented to person, place, and time. She appears well-developed and well-nourished.  HENT:  Head: Normocephalic and atraumatic.  Eyes: Conjunctivae and EOM are normal.  Cardiovascular: Normal rate, regular rhythm and normal heart sounds.   Pulmonary/Chest: Effort normal and breath sounds normal. No respiratory distress. She has no wheezes.  Abdominal: Normal  appearance and bowel sounds are normal. There is no tenderness. There is no CVA tenderness.  Neurological: he is alert and oriented to person, place, and time.    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 Referred for stress testing      Relevant Orders   ECHOCARDIOGRAM STRESS TEST   Prediabetes    Discussed that he could benefit from dietary changes.  If his a1c increases to greater than 6% would recommend low dose metformin        Other Visit Diagnoses    Need for influenza vaccination       Relevant Orders   Flu Vaccine QUAD 36+ mos IM (Completed)   Special screening for malignant neoplasms, colon       Relevant Orders   Ambulatory referral to Gastroenterology   Uncontrolled hypertension       Relevant Medications   valsartan-hydrochlorothiazide (DIOVAN HCT) 160-25 MG tablet      Discussed colon cancer screening  Referral placed today  REVIEWED ALL LABS WITH PATIENT   A total of 25 minutes were spent face-to-face with the patient during this encounter and over half of that time was spent on counseling and coordination of care.   [place]

## 2017-05-09 ENCOUNTER — Other Ambulatory Visit: Payer: Self-pay

## 2017-05-09 ENCOUNTER — Ambulatory Visit (INDEPENDENT_AMBULATORY_CARE_PROVIDER_SITE_OTHER): Payer: BLUE CROSS/BLUE SHIELD | Admitting: Family Medicine

## 2017-05-09 ENCOUNTER — Encounter: Payer: Self-pay | Admitting: Family Medicine

## 2017-05-09 VITALS — BP 130/74 | HR 94 | Temp 98.0°F | Resp 16 | Ht 66.42 in | Wt 150.4 lb

## 2017-05-09 DIAGNOSIS — Z1211 Encounter for screening for malignant neoplasm of colon: Secondary | ICD-10-CM

## 2017-05-09 DIAGNOSIS — R9431 Abnormal electrocardiogram [ECG] [EKG]: Secondary | ICD-10-CM

## 2017-05-09 DIAGNOSIS — R7303 Prediabetes: Secondary | ICD-10-CM | POA: Diagnosis not present

## 2017-05-09 DIAGNOSIS — I10 Essential (primary) hypertension: Secondary | ICD-10-CM | POA: Diagnosis not present

## 2017-05-09 DIAGNOSIS — Z23 Encounter for immunization: Secondary | ICD-10-CM

## 2017-05-09 MED ORDER — VALSARTAN-HYDROCHLOROTHIAZIDE 160-25 MG PO TABS: 1.0000 | ORAL_TABLET | Freq: Every day | ORAL | 1 refills | Status: DC

## 2017-05-09 NOTE — Patient Instructions (Addendum)
   IF you received an x-ray today, you will receive an invoice from [facility]. Please contact [facility] at [phone] with questions or concerns regarding your invoice.   IF you received labwork today, you will receive an invoice from [facility]. Please contact [facility] at [phone] with questions or concerns regarding your invoice.   Our billing staff will not be able to assist you with questions regarding bills from these companies.  You will be contacted with the lab results as soon as they are available. The fastest way to get your results is to activate your My Chart account. Instructions are located on the last page of this paperwork. If you have not heard from us regarding the results in 2 weeks, please contact this office.    Prediabetes Eating Plan Prediabetes-also called impaired glucose tolerance or impaired fasting glucose-is a condition that causes blood sugar (blood glucose) levels to be higher than normal. Following a healthy diet can help to keep prediabetes under control. It can also help to lower the risk of type 2 diabetes and heart disease, which are increased in people who have prediabetes. Along with regular exercise, a healthy diet:  Promotes weight loss.  Helps to control blood sugar levels.  Helps to improve the way that the body uses insulin.  What do I need to know about this eating plan?  Use the glycemic index (GI) to plan your meals. The index tells you how quickly a food will raise your blood sugar. Choose low-GI foods. These foods take a longer time to raise blood sugar.  Pay close attention to the amount of carbohydrates in the food that you eat. Carbohydrates increase blood sugar levels.  Keep track of how many calories you take in. Eating the right amount of calories will help you to achieve a healthy weight. Losing about 7 percent of your starting weight can help to prevent type 2 diabetes.  You may want to follow a  Mediterranean diet. This diet includes a lot of vegetables, lean meats or fish, whole grains, fruits, and healthy oils and fats. What foods can I eat? Grains Whole grains, such as whole-wheat or whole-grain breads, crackers, cereals, and pasta. Unsweetened oatmeal. Bulgur. Barley. Quinoa. Brown rice. Corn or whole-wheat flour tortillas or taco shells. Vegetables Lettuce. Spinach. Peas. Beets. Cauliflower. Cabbage. Broccoli. Carrots. Tomatoes. Squash. Eggplant. Herbs. Peppers. Onions. Cucumbers. Brussels sprouts. Fruits Berries. Bananas. Apples. Oranges. Grapes. Papaya. Mango. Pomegranate. Kiwi. Grapefruit. Cherries. Meats and Other Protein Sources Seafood. Lean meats, such as chicken and turkey or lean cuts of pork and beef. Tofu. Eggs. Nuts. Beans. Dairy Low-fat or fat-free dairy products, such as yogurt, cottage cheese, and cheese. Beverages Water. Tea. Coffee. Sugar-free or diet soda. Seltzer water. Milk. Milk alternatives, such as soy or almond milk. Condiments Mustard. Relish. Low-fat, low-sugar ketchup. Low-fat, low-sugar barbecue sauce. Low-fat or fat-free mayonnaise. Sweets and Desserts Sugar-free or low-fat pudding. Sugar-free or low-fat ice cream and other frozen treats. Fats and Oils Avocado. Walnuts. Olive oil. The items listed above may not be a complete list of recommended foods or beverages. Contact your dietitian for more options. What foods are not recommended? Grains Refined white flour and flour products, such as bread, pasta, snack foods, and cereals. Beverages Sweetened drinks, such as sweet iced tea and soda. Sweets and Desserts Baked goods, such as cake, cupcakes, pastries, cookies, and cheesecake. The items listed above may not be a complete list of foods and beverages to avoid. Contact your dietitian for more information.   This information is not intended to replace advice given to you by your health care provider. Make sure you discuss any questions you have with  your health care provider. Document Released: 09/29/2014 Document Revised: 10/21/2015 Document Reviewed: 06/10/2014 Elsevier Interactive Patient Education  2017 Elsevier Inc.  

## 2017-05-09 NOTE — Assessment & Plan Note (Signed)
Pt bp improved with increased med with diovan-hct 160-25 Also lifestyle modification

## 2017-05-09 NOTE — Addendum Note (Signed)
Addended by: Delia Chimes A on: 05/09/2017 10:52 AM   Modules accepted: Orders

## 2017-05-09 NOTE — Assessment & Plan Note (Signed)
Discussed that he could benefit from dietary changes.  If his a1c increases to greater than 6% would recommend low dose metformin

## 2017-05-09 NOTE — Assessment & Plan Note (Signed)
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 Referred for stress testing

## 2017-05-20 ENCOUNTER — Encounter (HOSPITAL_COMMUNITY): Payer: Self-pay

## 2017-05-20 ENCOUNTER — Emergency Department (HOSPITAL_COMMUNITY)
Admission: EM | Admit: 2017-05-20 | Discharge: 2017-05-20 | Disposition: A | Discharge status: Home / Self Care | Payer: BLUE CROSS/BLUE SHIELD | Attending: Emergency Medicine | Admitting: Emergency Medicine

## 2017-05-20 ENCOUNTER — Emergency Department (HOSPITAL_COMMUNITY): Payer: BLUE CROSS/BLUE SHIELD

## 2017-05-20 DIAGNOSIS — R202 Paresthesia of skin: Secondary | ICD-10-CM

## 2017-05-20 DIAGNOSIS — R200 Anesthesia of skin: Secondary | ICD-10-CM | POA: Insufficient documentation

## 2017-05-20 DIAGNOSIS — I10 Essential (primary) hypertension: Secondary | ICD-10-CM | POA: Insufficient documentation

## 2017-05-20 DIAGNOSIS — F410 Panic disorder [episodic paroxysmal anxiety] without agoraphobia: Secondary | ICD-10-CM

## 2017-05-20 LAB — I-STAT CHEM 8, ED
BUN: 25 mg/dL — AB (ref 6–20)
Calcium, Ion: 1.12 mmol/L — ABNORMAL LOW (ref 1.15–1.40)
Chloride: 100 mmol/L — ABNORMAL LOW (ref 101–111)
Creatinine, Ser: 1.1 mg/dL (ref 0.61–1.24)
Glucose, Bld: 142 mg/dL — ABNORMAL HIGH (ref 65–99)
HEMATOCRIT: 50 % (ref 39.0–52.0)
HEMOGLOBIN: 17 g/dL (ref 13.0–17.0)
Potassium: 3.7 mmol/L (ref 3.5–5.1)
SODIUM: 141 mmol/L (ref 135–145)
TCO2: 30 mmol/L (ref 22–32)

## 2017-05-20 LAB — DIFFERENTIAL
Basophils Absolute: 0 10*3/uL (ref 0.0–0.1)
Basophils Relative: 0 %
EOS ABS: 0.1 10*3/uL (ref 0.0–0.7)
EOS PCT: 1 %
LYMPHS ABS: 2.9 10*3/uL (ref 0.7–4.0)
Lymphocytes Relative: 58 %
Monocytes Absolute: 0.4 10*3/uL (ref 0.1–1.0)
Monocytes Relative: 7 %
NEUTROS PCT: 34 %
Neutro Abs: 1.7 10*3/uL (ref 1.7–7.7)

## 2017-05-20 LAB — CBC
HCT: 47.4 % (ref 39.0–52.0)
Hemoglobin: 15.6 g/dL (ref 13.0–17.0)
MCH: 27.8 pg (ref 26.0–34.0)
MCHC: 32.9 g/dL (ref 30.0–36.0)
MCV: 84.5 fL (ref 78.0–100.0)
PLATELETS: 202 10*3/uL (ref 150–400)
RBC: 5.61 MIL/uL (ref 4.22–5.81)
RDW: 14.5 % (ref 11.5–15.5)
WBC: 4.9 10*3/uL (ref 4.0–10.5)

## 2017-05-20 LAB — APTT: aPTT: 35 seconds (ref 24–36)

## 2017-05-20 LAB — COMPREHENSIVE METABOLIC PANEL
ALBUMIN: 4 g/dL (ref 3.5–5.0)
ALT: 28 U/L (ref 17–63)
ANION GAP: 9 (ref 5–15)
AST: 32 U/L (ref 15–41)
Alkaline Phosphatase: 87 U/L (ref 38–126)
BILIRUBIN TOTAL: 1.1 mg/dL (ref 0.3–1.2)
BUN: 21 mg/dL — ABNORMAL HIGH (ref 6–20)
CO2: 27 mmol/L (ref 22–32)
Calcium: 9.1 mg/dL (ref 8.9–10.3)
Chloride: 102 mmol/L (ref 101–111)
Creatinine, Ser: 1.28 mg/dL — ABNORMAL HIGH (ref 0.61–1.24)
GFR calc non Af Amer: >60 mL/min (ref >60)
GLUCOSE: 144 mg/dL — AB (ref 65–99)
POTASSIUM: 3.8 mmol/L (ref 3.5–5.1)
SODIUM: 138 mmol/L (ref 135–145)
TOTAL PROTEIN: 7.1 g/dL (ref 6.5–8.1)

## 2017-05-20 LAB — PROTIME-INR
INR: 0.88
PROTHROMBIN TIME: 11.9 s (ref 11.4–15.2)

## 2017-05-20 LAB — I-STAT TROPONIN, ED: Troponin i, poc: 0 ng/mL (ref 0.00–0.08)

## 2017-05-20 MED ORDER — HYDROXYZINE HCL 25 MG PO TABS: 25.0000 mg | ORAL_TABLET | Freq: Four times a day (QID) | ORAL | 0 refills | Status: DC | PRN

## 2017-05-20 NOTE — ED Notes (Signed)
Pt has returned from CT.  

## 2017-05-20 NOTE — ED Provider Notes (Signed)
[facility] EMERGENC[facility] DEPART[facility] Provider Note   CSN: [id number] Arrival date & time: 05/20/17  4098     History   Chief Complaint Chief Complaint  Patient presents with  . Numbness    HPI Theodore Rodriguez is a 51 y.o. male.  The history is provided by the patient and the spouse. No language interpreter was used.    Theodore Rodriguez is a 51 y.o. male who presents to the Emergency Department complaining of numbness.  He was in his routine state of health when he went to bed last night.  When he awoke around 6 AM this morning he felt some bubbling in his stomach and developed some associated shortness of breath and tingling throughout his face and bilateral hands with sweating in bilateral feet.  He states his head felt a little funny at the time.  Overall his symptoms have completely resolved in the emergency department.  He denies any associated headache, chest pain, abdominal pain, vomiting, diarrhea, weakness.  He does have a history of hypertension and drinks alcohol regularly.  He denies any recent stress but his wife reveals that he has lost several friends recently that are the same age as him.  He also endorses working long and irregular hours and poor sleep.  He had a similar episode about a month ago and was evaluated in another emergency department with no clear explanation for his symptoms.  Past Medical History:  Diagnosis Date  . Hypertension     Patient Active Problem List   Diagnosis Date Noted  . Essential hypertension 05/09/2017  . Nonspecific abnormal electrocardiogram (ECG) (EKG) 05/09/2017  . Prediabetes 05/09/2017    History reviewed. No pertinent surgical history.     Home Medications    Prior to Admission medications   Medication Sig Start Date End Date Taking? Authorizing Provider  valsartan-hydrochlorothiazide (DIOVAN HCT) 160-25 MG tablet Take 1 tablet by mouth daily. 05/09/17   Forrest Moron, MD    Family History Family History    Problem Relation Age of Onset  . Hypertension Mother     Social History Social History   Tobacco Use  . Smoking status: Never Smoker  . Smokeless tobacco: Never Used  Substance Use Topics  . Alcohol use: Yes    Comment: occasionally  . Drug use: No     Allergies   Patient has no known allergies.   Review of Systems Review of Systems  All other systems reviewed and are negative.    Physical Exam Updated Vital Signs BP (!) 158/103   Pulse 68   Temp 98.1 F (36.7 C) (Oral)   Resp 16   SpO2 98%   Physical Exam  Constitutional: He is oriented to person, place, and time. He appears well-developed and well-nourished.  HENT:  Head: Normocephalic and atraumatic.  Eyes: EOM are normal. Pupils are equal, round, and reactive to light.  Cardiovascular: Normal rate and regular rhythm.  No murmur heard. Pulmonary/Chest: Effort normal and breath sounds normal. No respiratory distress.  Abdominal: Soft. There is no tenderness. There is no rebound and no guarding.  Musculoskeletal: He exhibits no edema or tenderness.  Neurological: He is alert and oriented to person, place, and time. No cranial nerve deficit or sensory deficit. Coordination normal.  No pronator drift.  5 out of 5 strength in all 4 extremities.  Skin: Skin is warm and dry.  Psychiatric: He has a normal mood and affect. His behavior is normal.  Nursing note and vitals reviewed.  ED Treatments / Results  Labs (all labs ordered are listed, but only abnormal results are displayed) Labs Reviewed  COMPREHENSIVE METABOLIC PANEL - Abnormal; Notable for the following components:      Result Value   Glucose, Bld 144 (*)    BUN 21 (*)    Creatinine, Ser 1.28 (*)    All other components within normal limits  I-STAT CHEM 8, ED - Abnormal; Notable for the following components:   Chloride 100 (*)    BUN 25 (*)    Glucose, Bld 142 (*)    Calcium, Ion 1.12 (*)    All other components within normal limits   PROTIME-INR  APTT  CBC  DIFFERENTIAL  I-STAT TROPONIN, ED  CBG MONITORING, ED    EKG  EKG Interpretation  Date/Time:  Sunday May 20 2017 06:53:00 EST Ventricular Rate:  87 PR Interval:  178 QRS Duration: 78 QT Interval:  364 QTC Calculation: 438 R Axis:   59 Text Interpretation:  Sinus rhythm with marked sinus arrhythmia Otherwise normal ECG Confirmed by Quintella Reichert [phone]) on 05/20/2017 9:03:03 AM       Radiology Ct Head Wo Contrast  Result Date: 05/20/2017 CLINICAL DATA:  Woke up with tingling in face in both arms. EXAM: CT HEAD WITHOUT CONTRAST TECHNIQUE: Contiguous axial images were obtained from the base of the skull through the vertex without intravenous contrast. COMPARISON:  01/17/2015 FINDINGS: Brain: No evidence of acute infarction, hemorrhage, hydrocephalus, extra-axial collection or mass effect. Cavum velum interpositum cyst incidentally noted. Vascular: No hyperdense vessel or unexpected calcification. Skull: Normal. Negative for fracture or focal lesion. Sinuses/Orbits: No acute finding. IMPRESSION: No acute finding or explanation for symptoms. Electronically Signed   By: Monte Fantasia M.D.   On: 05/20/2017 07:43    Procedures Procedures (including critical care time)  Medications Ordered in ED Medications - No data to display   Initial Impression / Assessment and Plan / ED Course  I have reviewed the triage vital signs and the nursing notes.  Pertinent labs & imaging results that were available during my care of the patient were reviewed by me and considered in my medical decision making (see chart for details).     Patient here for evaluation of tingling, funny feeling in his head as well as some nausea/gas sensation.  His symptoms have completely resolved in the emergency department and he has a normal neurologic exam he is mildly anxious in the emergency department.  Presentation is not consistent with CVA, subarachnoid hemorrhage, ACS, PE,  TIA, hypertensive urgency.  There is concerned that patient is having anxiety with occasional panic attacks.  Counseled patient on alcohol use and abuse as well as stress and anxiety.  Discussed importance of PCP follow-up as well as return precautions.  Final Clinical Impressions(s) / ED Diagnoses   Final diagnoses:  None    ED Discharge Orders    None       Quintella Reichert, MD 05/20/17 [phone]

## 2017-05-20 NOTE — ED Notes (Signed)
Pt to CT at this time.

## 2017-05-20 NOTE — ED Triage Notes (Signed)
Pt states that went to bed around 1am LSN. Woke up feeling like he had gas about an hour ago.  C/o of tingling in his face, both sides, and his arms. Neuro intact. Pt states that he feels his speech is a little different

## 2017-05-23 ENCOUNTER — Telehealth (HOSPITAL_COMMUNITY): Payer: Self-pay | Admitting: Family Medicine

## 2017-05-24 ENCOUNTER — Telehealth: Payer: Self-pay | Admitting: Family Medicine

## 2017-05-24 DIAGNOSIS — R9431 Abnormal electrocardiogram [ECG] [EKG]: Secondary | ICD-10-CM

## 2017-05-24 DIAGNOSIS — I10 Essential (primary) hypertension: Secondary | ICD-10-CM

## 2017-05-24 NOTE — Telephone Encounter (Signed)
Copied from [facility] [id number]. Topic: Referral - Request >> May 24, 2017  9:50 AM Corie Chiquito, [place] wrote: Reason for CRM: Rollene Fare called because they need a order for a 2 D Echo for prior image. If someone could please give her a call back about this at [phone]

## 2017-05-24 NOTE — Telephone Encounter (Signed)
User: Cherie Dark A Date/time: 05/24/17 9:01 AM  Comment: Returned patient call from yesterday afternoon in regards to test.   Context:  Outcome: Left Message  Phone number: [phone] Phone Type: Home Phone  Comm. type: Telephone Call type: Outgoing  Contact: Lavada Mesi Relation to patient: Self    User: Cherie Dark A Date/time: 05/23/17 10:10 AM  Comment: Called pt and lmsg for him to CB to get scheduled for a Stress Echo.   Context:  Outcome: Left Message  Phone number: [phone] Phone Type: Home Phone  Comm. type: Telephone Call type: Outgoing  Contact: Lavada Mesi Relation to patient: Self

## 2017-05-26 NOTE — Telephone Encounter (Signed)
Please advise 

## 2017-05-30 ENCOUNTER — Ambulatory Visit: Payer: BLUE CROSS/BLUE SHIELD | Admitting: Podiatry

## 2017-06-07 ENCOUNTER — Telehealth (HOSPITAL_COMMUNITY): Payer: Self-pay | Admitting: *Deleted

## 2017-06-07 NOTE — Telephone Encounter (Signed)
Left message on voicemail in reference to upcoming appointment scheduled for 06/12/17. Phone number given for a call back so details instructions can be given. Theodore Rodriguez

## 2017-06-11 ENCOUNTER — Encounter: Payer: Self-pay | Admitting: Family Medicine

## 2017-06-11 NOTE — Telephone Encounter (Signed)
Called and could not get anyone on the line   Order placed for 2D echo

## 2017-06-12 ENCOUNTER — Ambulatory Visit (HOSPITAL_COMMUNITY): Payer: BLUE CROSS/BLUE SHIELD

## 2017-06-12 ENCOUNTER — Ambulatory Visit (HOSPITAL_COMMUNITY): Payer: BLUE CROSS/BLUE SHIELD | Attending: Cardiology

## 2017-06-12 DIAGNOSIS — R9431 Abnormal electrocardiogram [ECG] [EKG]: Secondary | ICD-10-CM | POA: Insufficient documentation

## 2017-06-14 ENCOUNTER — Telehealth: Payer: Self-pay | Admitting: Family Medicine

## 2017-06-14 NOTE — Telephone Encounter (Signed)
Pt has order in for Echocardiogram complete from 06/11/17. I saw in chart that pt had an Echo Stress Test done on 06/12/17. Is he still needing the Echo complete?

## 2017-06-16 NOTE — Telephone Encounter (Signed)
He does not need any additional Echos at this time. Please cancel

## 2017-06-18 NOTE — Telephone Encounter (Signed)
Can you cancel the order because it was never routed anywhere, so Deneise Lever doesn't have to call anyone since he wasn't referred.

## 2017-06-19 NOTE — Telephone Encounter (Signed)
Order completed

## 2017-11-16 ENCOUNTER — Other Ambulatory Visit: Payer: Self-pay | Admitting: Family Medicine

## 2017-11-16 DIAGNOSIS — I10 Essential (primary) hypertension: Secondary | ICD-10-CM

## 2017-11-16 NOTE — Telephone Encounter (Signed)
Diovan-HCT 160-25 mg refill request  LOV 07/10/16 with Dr. Nolon Rod.   No appts since.  [facility] [address] - [place], [place] - [address]

## 2017-11-16 NOTE — Telephone Encounter (Signed)
Refill request for valsartan-hctz 160-25 mg #30 w/no refills approved. Dgaddy, CMA

## 2017-12-26 ENCOUNTER — Ambulatory Visit: Payer: BLUE CROSS/BLUE SHIELD | Admitting: Family Medicine

## 2018-01-02 ENCOUNTER — Ambulatory Visit (INDEPENDENT_AMBULATORY_CARE_PROVIDER_SITE_OTHER): Payer: BLUE CROSS/BLUE SHIELD | Admitting: Family Medicine

## 2018-01-02 ENCOUNTER — Other Ambulatory Visit: Payer: Self-pay

## 2018-01-02 VITALS — BP 119/83 | HR 76 | Temp 99.0°F | Resp 16 | Wt 143.0 lb

## 2018-01-02 DIAGNOSIS — Z125 Encounter for screening for malignant neoplasm of prostate: Secondary | ICD-10-CM | POA: Diagnosis not present

## 2018-01-02 DIAGNOSIS — I10 Essential (primary) hypertension: Secondary | ICD-10-CM

## 2018-01-02 DIAGNOSIS — F5101 Primary insomnia: Secondary | ICD-10-CM

## 2018-01-02 DIAGNOSIS — R7303 Prediabetes: Secondary | ICD-10-CM

## 2018-01-02 MED ORDER — VALSARTAN-HYDROCHLOROTHIAZIDE 160-25 MG PO TABS: 1.0000 | ORAL_TABLET | Freq: Every day | ORAL | 1 refills | Status: DC

## 2018-01-02 NOTE — Progress Notes (Signed)
Chief Complaint  Patient presents with  . Hypertension    discuss refill  . Insomnia    x past two weeks    HPI  Hypertension: Patient here for follow-up of elevated blood pressure. He is exercising and is adherent to low salt diet.  Blood pressure is well controlled at home. Cardiac symptoms none. Patient denies chest pain, chest pressure/discomfort, claudication, dyspnea, fatigue, irregular heart beat, lower extremity edema, near-syncope, orthopnea, palpitations and paroxysmal nocturnal dyspnea.  Cardiovascular risk factors: hypertension and male gender. Use of agents associated with hypertension: none. History of target organ damage: none. 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 Hypertension     Current Outpatient Medications  Medication Sig Dispense Refill  . valsartan-hydrochlorothiazide (DIOVAN-HCT) 160-25 MG tablet TAKE 1 TABLET BY MOUTH EVERY DAY 30 tablet 0  . hydrOXYzine (ATARAX/VISTARIL) 25 MG tablet Take 1 tablet (25 mg total) by mouth every 6 (six) hours as needed for anxiety. (Patient not taking: Reported on 01/02/2018) 12 tablet 0   No current facility-administered medications for this visit.     Allergies: No Known Allergies  No past surgical history on file.  Social History    Socioeconomic History  . Marital status: Married    Spouse name: Not on file  . Number of children: Not on file  . Years of education: Not on file  . Highest education level: Not on file  Occupational History  . Not on file  Social Needs  . Financial resource strain: Not on file  . Food insecurity:    Worry: Not on file    Inability: Not on file  . Transportation needs:    Medical: Not on file    Non-medical: Not on file  Tobacco Use  . Smoking status: Never Smoker  . Smokeless tobacco: Never Used  Substance and Sexual Activity  . Alcohol use: Yes    Comment: occasionally  . Drug use: No  . Sexual activity: Not on file  Lifestyle  . Physical activity:    Days per week: Not on file    Minutes per session: Not on file  . Stress: Not on file  Relationships  . Social connections:    Talks on phone: Not on file    Gets together: Not on file    Attends religious service: Not on file    Active member of club or organization: Not on file    Attends meetings of clubs or organizations: Not on file    Relationship status: Not on file  Other Topics Concern  . Not on file  Social History Narrative  . Not on file    Family History  Problem Relation Age of  Onset  . Hypertension Mother      ROS Review of Systems See HPI Constitution: No fevers or chills No malaise No diaphoresis Skin: No rash or itching Eyes: no blurry vision, no double vision GU: no dysuria or hematuria Neuro: no dizziness or headaches all others reviewed and negative   Objective: Vitals:   01/02/18 1130  BP: 119/83  Pulse: 76  Resp: 16  Temp: 99 F (37.2 C)  TempSrc: Oral  SpO2: 93%  Weight: 143 lb (64.9 kg)    Physical Exam  Constitutional: He is oriented to person, place, and time. He appears well-developed and well-nourished.  HENT:  Head: Normocephalic and atraumatic.  Eyes: Conjunctivae and EOM are normal.  Neck: Normal range of motion. Neck supple.  Cardiovascular: Normal  rate, regular rhythm and normal heart sounds.  No murmur heard. Pulmonary/Chest: Effort normal and breath sounds normal. No stridor. No respiratory distress. He has no wheezes.  Musculoskeletal: Normal range of motion. He exhibits no edema.  Neurological: He is alert and oriented to person, place, and time.  Skin: Skin is warm. Capillary refill takes less than 2 seconds.  Psychiatric: He has a normal mood and affect. His behavior is normal. Judgment and thought content normal.    Assessment and Plan Sim was seen today for hypertension and insomnia.  Diagnoses and all orders for this visit:  Essential hypertension- Patient's blood pressure is at goal of 139/89 or less. Condition is stable. Continue current medications and treatment plan. I recommend that you exercise for 30-45 minutes 5 days a week. I also recommend a balanced diet with fruits and vegetables every day, lean meats, and little fried foods. The DASH diet (you can find this online) is a good example of this.  -     Comprehensive metabolic panel -     Lipid panel  Prediabetes- discussed diabetes prevention -     Hemoglobin A1c  Primary insomnia- discussed environmental changes   Screening for malignant neoplasm of prostate -     PSA     Aubriella Perezgarcia A Nolon Rod

## 2018-01-02 NOTE — Patient Instructions (Addendum)
Some sleep strategies  Try melatonin at bedtime  Limit tv in the bedroom Change your phone to night time settings Avoid bright lights Use a white noise machine instead of the tv   IF you received an x-ray today, you will receive an invoice from [facility]. Please contact [facility] at [phone] with questions or concerns regarding your invoice.   IF you received labwork today, you will receive an invoice from [place]. Please contact [facility] at [phone] with questions or concerns regarding your invoice.   Our billing staff will not be able to assist you with questions regarding bills from these companies.  You will be contacted with the lab results as soon as they are available. The fastest way to get your results is to activate your My Chart account. Instructions are located on the last page of this paperwork. If you have not heard from [place] regarding the results in 2 weeks, please contact this office.    Insomnia Insomnia is a sleep disorder that makes it difficult to fall asleep or to stay asleep. Insomnia can cause tiredness (fatigue), low energy, difficulty concentrating, mood swings, and poor performance at work or school. There are three different ways to classify insomnia:  Difficulty falling asleep.  Difficulty staying asleep.  Waking up too early in the morning.  Any type of insomnia can be long-term (chronic) or short-term (acute). Both are common. Short-term insomnia usually lasts for three months or less. Chronic insomnia occurs at least three times a week for longer than three months. What are the causes? Insomnia may be caused by another condition, situation, or substance, such as:  Anxiety.  Certain medicines.  Gastroesophageal reflux disease (GERD) or other gastrointestinal conditions.  Asthma or other breathing conditions.  Restless legs syndrome, sleep apnea, or other sleep disorders.  Chronic pain.  Menopause. This may include  hot flashes.  Stroke.  Abuse of alcohol, tobacco, or illegal drugs.  Depression.  Caffeine.  Neurological disorders, such as Alzheimer disease.  An overactive thyroid (hyperthyroidism).  The cause of insomnia may not be known. What increases the risk? Risk factors for insomnia include:  Gender. Women are more commonly affected than men.  Age. Insomnia is more common as you get older.  Stress. This may involve your professional or personal life.  Income. Insomnia is more common in people with lower income.  Lack of exercise.  Irregular work schedule or night shifts.  Traveling between different time zones.  What are the signs or symptoms? If you have insomnia, trouble falling asleep or trouble staying asleep is the main symptom. This may lead to other symptoms, such as:  Feeling fatigued.  Feeling nervous about going to sleep.  Not feeling rested in the morning.  Having trouble concentrating.  Feeling irritable, anxious, or depressed.  How is this treated? Treatment for insomnia depends on the cause. If your insomnia is caused by an underlying condition, treatment will focus on addressing the condition. Treatment may also include:  Medicines to help you sleep.  Counseling or therapy.  Lifestyle adjustments.  Follow these instructions at home:  Take medicines only as directed by your health care provider.  Keep regular sleeping and waking hours. Avoid naps.  Keep a sleep diary to help you and your health care provider figure out what could be causing your insomnia. Include: ? When you sleep. ? When you wake up during the night. ? How well you sleep. ? How rested you feel the next day. ? Any side effects  of medicines you are taking. ? What you eat and drink.  Make your bedroom a comfortable place where it is easy to fall asleep: ? Put up shades or special blackout curtains to block light from outside. ? Use a white noise machine to block  noise. ? Keep the temperature cool.  Exercise regularly as directed by your health care provider. Avoid exercising right before bedtime.  Use relaxation techniques to manage stress. Ask your health care provider to suggest some techniques that may work well for you. These may include: ? Breathing exercises. ? Routines to release muscle tension. ? Visualizing peaceful scenes.  Cut back on alcohol, caffeinated beverages, and cigarettes, especially close to bedtime. These can disrupt your sleep.  Do not overeat or eat spicy foods right before bedtime. This can lead to digestive discomfort that can make it hard for you to sleep.  Limit screen use before bedtime. This includes: ? Watching TV. ? Using your smartphone, tablet, and computer.  Stick to a routine. This can help you fall asleep faster. Try to do a quiet activity, brush your teeth, and go to bed at the same time each night.  Get out of bed if you are still awake after 15 minutes of trying to sleep. Keep the lights down, but try reading or doing a quiet activity. When you feel sleepy, go back to bed.  Make sure that you drive carefully. Avoid driving if you feel very sleepy.  Keep all follow-up appointments as directed by your health care provider. This is important. Contact a health care provider if:  You are tired throughout the day or have trouble in your daily routine due to sleepiness.  You continue to have sleep problems or your sleep problems get worse. Get help right away if:  You have serious thoughts about hurting yourself or someone else. This information is not intended to replace advice given to you by your health care provider. Make sure you discuss any questions you have with your health care provider. Document Released: 05/12/2000 Document Revised: 10/15/2015 Document Reviewed: 02/13/2014 Elsevier Interactive Patient Education  [employer].

## 2018-01-03 LAB — COMPREHENSIVE METABOLIC PANEL
A/G RATIO: 1.8 (ref 1.2–2.2)
ALBUMIN: 4.5 g/dL (ref 3.5–5.5)
ALT: 23 IU/L (ref 0–44)
AST: 22 IU/L (ref 0–40)
Alkaline Phosphatase: 57 IU/L (ref 39–117)
BUN/Creatinine Ratio: 17 (ref 9–20)
BUN: 17 mg/dL (ref 6–24)
Bilirubin Total: 1.2 mg/dL (ref 0.0–1.2)
CALCIUM: 9.6 mg/dL (ref 8.7–10.2)
CO2: 27 mmol/L (ref 20–29)
Chloride: 96 mmol/L (ref 96–106)
Creatinine, Ser: 0.99 mg/dL (ref 0.76–1.27)
GFR, EST AFRICAN AMERICAN: 100 mL/min/{1.73_m2} (ref >59)
GFR, EST NON AFRICAN AMERICAN: 87 mL/min/{1.73_m2} (ref >59)
GLOBULIN, TOTAL: 2.5 g/dL (ref 1.5–4.5)
Glucose: 93 mg/dL (ref 65–99)
POTASSIUM: 4.6 mmol/L (ref 3.5–5.2)
SODIUM: 139 mmol/L (ref 134–144)
TOTAL PROTEIN: 7 g/dL (ref 6.0–8.5)

## 2018-01-03 LAB — LIPID PANEL
CHOL/HDL RATIO: 2.4 ratio (ref 0.0–5.0)
Cholesterol, Total: 174 mg/dL (ref 100–199)
HDL: 72 mg/dL (ref >39)
LDL CALC: 92 mg/dL (ref 0–99)
Triglycerides: 52 mg/dL (ref 0–149)
VLDL Cholesterol Cal: 10 mg/dL (ref 5–40)

## 2018-01-03 LAB — HEMOGLOBIN A1C
Est. average glucose Bld gHb Est-mCnc: 134 mg/dL
HEMOGLOBIN A1C: 6.3 % — AB (ref 4.8–5.6)

## 2018-01-03 MED ORDER — METFORMIN HCL 500 MG PO TABS: 500.0000 mg | ORAL_TABLET | Freq: Every day | ORAL | 3 refills | Status: DC

## 2018-01-03 NOTE — Addendum Note (Signed)
Addended by: Delia Chimes A on: 01/03/2018 12:39 PM   Modules accepted: Orders

## 2018-01-15 ENCOUNTER — Telehealth: Payer: Self-pay | Admitting: Family Medicine

## 2018-01-15 NOTE — Telephone Encounter (Signed)
Copied from [place] [id number] [id number]. Topic: Quick Communication - Office Called Patient >> Jan 14, 2018  5:58 PM Neva Seat wrote: Pt returned missed call.  Please call pt back if needed.

## 2018-01-29 ENCOUNTER — Other Ambulatory Visit: Payer: Self-pay

## 2018-01-29 ENCOUNTER — Ambulatory Visit: Payer: Self-pay | Admitting: General Practice

## 2018-01-29 ENCOUNTER — Encounter: Payer: Self-pay | Admitting: Family Medicine

## 2018-01-29 ENCOUNTER — Ambulatory Visit (INDEPENDENT_AMBULATORY_CARE_PROVIDER_SITE_OTHER): Payer: BLUE CROSS/BLUE SHIELD | Admitting: Family Medicine

## 2018-01-29 VITALS — BP 130/82 | HR 97 | Temp 99.4°F | Resp 18 | Ht 66.42 in | Wt 146.8 lb

## 2018-01-29 DIAGNOSIS — M546 Pain in thoracic spine: Secondary | ICD-10-CM

## 2018-01-29 DIAGNOSIS — I10 Essential (primary) hypertension: Secondary | ICD-10-CM | POA: Diagnosis not present

## 2018-01-29 MED ORDER — CYCLOBENZAPRINE HCL 5 MG PO TABS: 5.0000 mg | ORAL_TABLET | Freq: Three times a day (TID) | ORAL | 0 refills | Status: DC | PRN

## 2018-01-29 MED ORDER — IBUPROFEN 600 MG PO TABS: 600.0000 mg | ORAL_TABLET | Freq: Three times a day (TID) | ORAL | 0 refills | Status: DC | PRN
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
## 2018-01-29 NOTE — Patient Instructions (Addendum)
You should not take Dextromorphan or Phenylephrine These are common found in decongestant and cold medications This can cause very high pulses and high blood pressure.  Ask you pharmacist to make sure this ingredient is not present     If you have lab work done today you will be contacted with your lab results within the next 2 weeks.  If you have not heard from [place] then please contact us. The fastest way to get your results is to register for My Chart.   IF you received an x-ray today, you will receive an invoice from [facility]. Please contact [facility] at [phone] with questions or concerns regarding your invoice.   IF you received labwork today, you will receive an invoice from [place]. Please contact [facility] at [phone] with questions or concerns regarding your invoice.   Our billing staff will not be able to assist you with questions regarding bills from these companies.  You will be contacted with the lab results as soon as they are available. The fastest way to get your results is to activate your My Chart account. Instructions are located on the last page of this paperwork. If you have not heard from [place] regarding the results in 2 weeks, please contact this office.     Muscle Strain A muscle strain is an injury that occurs when a muscle is stretched beyond its normal length. Usually a small number of muscle fibers are torn when this happens. Muscle strain is rated in degrees. First-degree strains have the least amount of muscle fiber tearing and pain. Second-degree and third-degree strains have increasingly more tearing and pain. Usually, recovery from muscle strain takes 1-2 weeks. Complete healing takes 5-6 weeks. What are the causes? Muscle strain happens when a sudden, violent force placed on a muscle stretches it too far. This may occur with lifting, sports, or a fall. What increases the risk? Muscle strain is especially common in  athletes. What are the signs or symptoms? At the site of the muscle strain, there may be:  Pain.  Bruising.  Swelling.  Difficulty using the muscle due to pain or lack of normal function.  How is this diagnosed? Your health care provider will perform a physical exam and ask about your medical history. How is this treated? Often, the best treatment for a muscle strain is resting, icing, and applying cold compresses to the injured area. Follow these instructions at home:  Use the PRICE method of treatment to promote muscle healing during the first 2-3 days after your injury. The PRICE method involves: ? Protecting the muscle from being injured again. ? Restricting your activity and resting the injured body part. ? Icing your injury. To do this, put ice in a plastic bag. Place a towel between your skin and the bag. Then, apply the ice and leave it on from 15-20 minutes each hour. After the third day, switch to moist heat packs. ? Apply compression to the injured area with a splint or elastic bandage. Be careful not to wrap it too tightly. This may interfere with blood circulation or increase swelling. ? Elevate the injured body part above the level of your heart as often as you can.  Only take over-the-counter or prescription medicines for pain, discomfort, or fever as directed by your health care provider.  Warming up prior to exercise helps to prevent future muscle strains. Contact a health care provider if:  You have increasing pain or swelling in the injured area.  You  have numbness, tingling, or a significant loss of strength in the injured area. This information is not intended to replace advice given to you by your health care provider. Make sure you discuss any questions you have with your health care provider. Document Released: 05/15/2005 Document Revised: 10/21/2015 Document Reviewed: 12/12/2012 Elsevier Interactive Patient Education  2017 [employer].
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
## 2018-01-31 ENCOUNTER — Telehealth: Payer: Self-pay | Admitting: Family Medicine

## 2018-01-31 NOTE — Telephone Encounter (Signed)
Please advise 

## 2018-01-31 NOTE — Telephone Encounter (Signed)
Copied from [place] [phone]. Topic: General - Other >> Jan 31, 2018  8:29 AM Synthia Innocent wrote: Reason for CRM: Requesting work note to be extended for the remainder of the week, still having back pain.

## 2018-02-01 ENCOUNTER — Telehealth: Payer: Self-pay

## 2018-02-01 NOTE — Telephone Encounter (Signed)
Please extend the work note for this patient as he indicated.  Thank you.

## 2018-02-01 NOTE — Telephone Encounter (Signed)
Left detailed message on voicemail per [employer] advising dr Nolon Rod has approved keeping him out of work for the remainder of this week.  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  Advised to call with any further questions or concerns. Dgaddy, CMA

## 2018-02-05 ENCOUNTER — Telehealth: Payer: Self-pay | Admitting: Family Medicine

## 2018-02-05 NOTE — Telephone Encounter (Signed)
Copied from [place] [phone]. Topic: General - Other >> Feb 05, 2018 12:58 PM Keene Breath wrote: Reason for CRM: Patient called to speak with a nurse because after lifting weights he took his BP and the reading said irregular heart beat.  Patient was concerned and wants a nurse to call him back to discuss.  CB# [phone].

## 2018-02-06 NOTE — Telephone Encounter (Signed)
Please see note below. 

## 2018-02-06 NOTE — Telephone Encounter (Signed)
Spoke with pt advised hr will be irregular due to exercise and lifting weights.  Advised Echo was ok and to stop stressing and he is fine.  Pt agreeable. Dgaddy, CMA

## 2018-04-16 ENCOUNTER — Other Ambulatory Visit: Payer: Self-pay

## 2018-04-16 ENCOUNTER — Encounter: Payer: Self-pay | Admitting: Family Medicine

## 2018-04-16 ENCOUNTER — Ambulatory Visit (INDEPENDENT_AMBULATORY_CARE_PROVIDER_SITE_OTHER): Payer: BLUE CROSS/BLUE SHIELD | Admitting: Family Medicine

## 2018-04-16 VITALS — BP 122/81 | HR 74 | Temp 98.4°F | Resp 16 | Ht 65.0 in | Wt 146.0 lb

## 2018-04-16 DIAGNOSIS — Z23 Encounter for immunization: Secondary | ICD-10-CM | POA: Diagnosis not present

## 2018-04-16 DIAGNOSIS — R7303 Prediabetes: Secondary | ICD-10-CM

## 2018-04-16 DIAGNOSIS — Z1211 Encounter for screening for malignant neoplasm of colon: Secondary | ICD-10-CM

## 2018-04-16 DIAGNOSIS — I10 Essential (primary) hypertension: Secondary | ICD-10-CM | POA: Diagnosis not present

## 2018-04-16 LAB — POCT GLYCOSYLATED HEMOGLOBIN (HGB A1C): Hemoglobin A1C: 5.9 % — AB (ref 4.0–5.6)

## 2018-04-16 NOTE — Progress Notes (Signed)
Chief Complaint  Patient presents with  . Hgb A1c    HPI  Prediabetes Denies polyuria, polydipsia, polyphagia He is doing his best to exercise Body mass index is 24.3 kg/m. Wt Readings from Last 3 Encounters:  04/16/18 146 lb (66.2 kg)  01/29/18 146 lb 12.8 oz (66.6 kg)  01/02/18 143 lb (64.9 kg)   He reports that he does not have a strong history of diabetes in the family He is eating well  Hypertension: Patient here for follow-up of elevated blood pressure. He is exercising and is adherent to low salt diet.  Blood pressure is well controlled at home. Cardiac symptoms none. Patient denies chest pain, chest pressure/discomfort, claudication, exertional chest pressure/discomfort, fatigue and irregular heart beat.  Cardiovascular risk factors: hypertension and male gender. Use of agents associated with hypertension: none. History of target organ damage: none.  BP Readings from Last 3 Encounters:  04/16/18 122/81  01/29/18 130/82  01/02/18 119/83    Colon Cancer Screening He has never had a colonoscopy He denies blood in his stool, unexpected weight loss or pain with defecation No rectal itching He does not smoke He does have a family history of cancer but is not sure if it is colon cancer. His mother had colon polyps.     Past Medical History:  Diagnosis Date  . Hypertension     Current Outpatient Medications  Medication Sig Dispense Refill  . cyclobenzaprine (FLEXERIL) 5 MG tablet Take 1 tablet (5 mg total) by mouth 3 (three) times daily as needed for muscle spasms. 30 tablet 0  . hydrOXYzine (ATARAX/VISTARIL) 25 MG tablet Take 1 tablet (25 mg total) by mouth every 6 (six) hours as needed for anxiety. 12 tablet 0  . ibuprofen (ADVIL,MOTRIN) 600 MG tablet Take 1 tablet (600 mg total) by mouth every 8 (eight) hours as needed. 30 tablet 0  . metFORMIN (GLUCOPHAGE) 500 MG tablet Take 1 tablet (500 mg total) by mouth daily with breakfast. 90 tablet 3  .  valsartan-hydrochlorothiazide (DIOVAN-HCT) 160-25 MG tablet Take 1 tablet by mouth daily. 90 tablet 1   No current facility-administered medications for this visit.     Allergies: No Known Allergies  No past surgical history on file.  Social History   Socioeconomic History  . Marital status: Married    Spouse name: Not on file  . Number of children: Not on file  . Years of education: Not on file  . Highest education level: Not on file  Occupational History  . Not on file  Social Needs  . Financial resource strain: Not on file  . Food insecurity:    Worry: Not on file    Inability: Not on file  . Transportation needs:    Medical: Not on file    Non-medical: Not on file  Tobacco Use  . Smoking status: Never Smoker  . Smokeless tobacco: Never Used  Substance and Sexual Activity  . Alcohol use: Yes    Comment: occasionally  . Drug use: No  . Sexual activity: Not on file  Lifestyle  . Physical activity:    Days per week: Not on file    Minutes per session: Not on file  . Stress: Not on file  Relationships  . Social connections:    Talks on phone: Not on file    Gets together: Not on file    Attends religious service: Not on file    Active member of club or organization: Not on file    Attends  meetings of clubs or organizations: Not on file    Relationship status: Not on file  Other Topics Concern  . Not on file  Social History Narrative  . Not on file    Family History  Problem Relation Age of Onset  . Hypertension Mother      ROS Review of Systems See HPI Constitution: No fevers or chills No malaise No diaphoresis Skin: No rash or itching Eyes: no blurry vision, no double vision GU: no dysuria or hematuria Neuro: no dizziness or headaches * all others reviewed and negative   Objective: Vitals:   04/16/18 1025  BP: 122/81  Pulse: 74  Resp: 16  Temp: 98.4 F (36.9 C)  TempSrc: Oral  SpO2: 98%  Weight: 146 lb (66.2 kg)  Height: 5\' 5"  (1.651  m)    Physical Exam  Constitutional: He is oriented to person, place, and time. He appears well-developed and well-nourished.  HENT:  Head: Normocephalic and atraumatic.  Eyes: Conjunctivae and EOM are normal.  Neck: Normal range of motion. Neck supple.  Cardiovascular: Normal rate, regular rhythm and normal heart sounds.  No murmur heard. Pulmonary/Chest: Effort normal and breath sounds normal. No stridor. No respiratory distress.  Abdominal: Soft. Bowel sounds are normal. He exhibits no distension and no mass. There is no tenderness. There is no guarding.  Neurological: He is alert and oriented to person, place, and time.  Skin: Skin is warm. Capillary refill takes less than 2 seconds.  Psychiatric: He has a normal mood and affect. His behavior is normal. Judgment and thought content normal.    Assessment and Plan Eastin was seen today for hgb a1c.  Diagnoses and all orders for this visit:  Essential hypertension- Patient's blood pressure is at goal of 139/89 or less. Condition is stable. Continue current medications and treatment plan. I recommend that you exercise for 30-45 minutes 5 days a week. I also recommend a balanced diet with fruits and vegetables every day, lean meats, and little fried foods. The DASH diet (you can find this online) is a good example of this.  -     Comprehensive metabolic panel  Flu vaccine need -     Flu Vaccine QUAD 36+ mos IM -     Comprehensive metabolic panel  Prediabetes- improved with metformin Continue exercise and renal funciton -     Comprehensive metabolic panel -     POCT glycosylated hemoglobin (Hb A1C)  Special screening for malignant neoplasms, colon- discussed options for screenings -     Ambulatory referral to Gastroenterology     [place]

## 2018-04-16 NOTE — Patient Instructions (Addendum)
   If you have lab work done today you will be contacted with your lab results within the next 2 weeks.  If you have not heard from us then please contact us. The fastest way to get your results is to register for My Chart.   IF you received an x-ray today, you will receive an invoice from [facility]. Please contact [facility] at [phone] with questions or concerns regarding your invoice.   IF you received labwork today, you will receive an invoice from [facility]. Please contact [facility] at [phone] with questions or concerns regarding your invoice.   Our billing staff will not be able to assist you with questions regarding bills from these companies.  You will be contacted with the lab results as soon as they are available. The fastest way to get your results is to activate your My Chart account. Instructions are located on the last page of this paperwork. If you have not heard from us regarding the results in 2 weeks, please contact this office.    Influenza (Flu) Vaccine (Inactivated or Recombinant): What You Need to Know 1. Why get vaccinated? Influenza ("flu") is a contagious disease that spreads around the [place] every year, usually between October and May. Flu is caused by influenza viruses, and is spread mainly by coughing, sneezing, and close contact. Anyone can get flu. Flu strikes suddenly and can last several days. Symptoms vary by age, but can include:  fever/chills  sore throat  muscle aches  fatigue  cough  headache  runny or stuffy nose  Flu can also lead to pneumonia and blood infections, and cause diarrhea and seizures in children. If you have a medical condition, such as heart or lung disease, flu can make it worse. Flu is more dangerous for some people. Infants and young children, people 65 years of age and older, pregnant women, and people with certain health conditions or a weakened immune system are at greatest risk. Each  year thousands of people in the [place] die from flu, and many more are hospitalized. Flu vaccine can:  keep you from getting flu,  make flu less severe if you do get it, and  keep you from spreading flu to your family and other people. 2. Inactivated and recombinant flu vaccines A dose of flu vaccine is recommended every flu season. Children 6 months through 8 years of age may need two doses during the same flu season. Everyone else needs only one dose each flu season. Some inactivated flu vaccines contain a very small amount of a mercury-based preservative called thimerosal. Studies have not shown thimerosal in vaccines to be harmful, but flu vaccines that do not contain thimerosal are available. There is no live flu virus in flu shots. They cannot cause the flu. There are many flu viruses, and they are always changing. Each year a new flu vaccine is made to protect against three or four viruses that are likely to cause disease in the upcoming flu season. But even when the vaccine doesn't exactly match these viruses, it may still provide some protection. Flu vaccine cannot prevent:  flu that is caused by a virus not covered by the vaccine, or  illnesses that look like flu but are not.  It takes about 2 weeks for protection to develop after vaccination, and protection lasts through the flu season. 3. Some people should not get this vaccine Tell the person who is giving you the vaccine:  If you have any severe,   life-threatening allergies. If you ever had a life-threatening allergic reaction after a dose of flu vaccine, or have a severe allergy to any part of this vaccine, you may be advised not to get vaccinated. Most, but not all, types of flu vaccine contain a small amount of egg protein.  If you ever had Guillain-Barr Syndrome (also called GBS). Some people with a history of GBS should not get this vaccine. This should be discussed with your doctor.  If you are not feeling well.  It is usually okay to get flu vaccine when you have a mild illness, but you might be asked to come back when you feel better.  4. Risks of a vaccine reaction With any medicine, including vaccines, there is a chance of reactions. These are usually mild and go away on their own, but serious reactions are also possible. Most people who get a flu shot do not have any problems with it. Minor problems following a flu shot include:  soreness, redness, or swelling where the shot was given  hoarseness  sore, red or itchy eyes  cough  fever  aches  headache  itching  fatigue  If these problems occur, they usually begin soon after the shot and last 1 or 2 days. More serious problems following a flu shot can include the following:  There may be a small increased risk of Guillain-Barre Syndrome (GBS) after inactivated flu vaccine. This risk has been estimated at 1 or 2 additional cases per million people vaccinated. This is much lower than the risk of severe complications from flu, which can be prevented by flu vaccine.  Young children who get the flu shot along with pneumococcal vaccine (PCV13) and/or DTaP vaccine at the same time might be slightly more likely to have a seizure caused by fever. Ask your doctor for more information. Tell your doctor if a child who is getting flu vaccine has ever had a seizure.  Problems that could happen after any injected vaccine:  People sometimes faint after a medical procedure, including vaccination. Sitting or lying down for about 15 minutes can help prevent fainting, and injuries caused by a fall. Tell your doctor if you feel dizzy, or have vision changes or ringing in the ears.  Some people get severe pain in the shoulder and have difficulty moving the arm where a shot was given. This happens very rarely.  Any medication can cause a severe allergic reaction. Such reactions from a vaccine are very rare, estimated at about 1 in a million doses, and  would happen within a few minutes to a few hours after the vaccination. As with any medicine, there is a very remote chance of a vaccine causing a serious injury or death. The safety of vaccines is always being monitored. For more information, visit: www.cdc.gov/vaccinesafety/ 5. What if there is a serious reaction? What should I look for? Look for anything that concerns you, such as signs of a severe allergic reaction, very high fever, or unusual behavior. Signs of a severe allergic reaction can include hives, swelling of the face and throat, difficulty breathing, a fast heartbeat, dizziness, and weakness. These would start a few minutes to a few hours after the vaccination. What should I do?  If you think it is a severe allergic reaction or other emergency that can't wait, call 9-1-1 and get the person to the nearest hospital. Otherwise, call your doctor.  Reactions should be reported to the Vaccine Adverse Event Reporting System (VAERS). Your doctor should file   this report, or you can do it yourself through the VAERS web site at www.vaers.hhs.gov, or by calling 1-800-822-7967. ? VAERS does not give medical advice. 6. The National Vaccine Injury Compensation Program The National Vaccine Injury Compensation Program (VICP) is a federal program that was created to compensate people who may have been injured by certain vaccines. Persons who believe they may have been injured by a vaccine can learn about the program and about filing a claim by calling 1-800-338-2382 or visiting the VICP website at www.hrsa.gov/vaccinecompensation. There is a time limit to file a claim for compensation. 7. How can I learn more?  Ask your healthcare provider. He or she can give you the vaccine package insert or suggest other sources of information.  Call your local or state health department.  Contact the Centers for Disease Control and Prevention (CDC): ? Call 1-800-232-4636 (1-800-CDC-INFO) or ? Visit CDC's  website at www.cdc.gov/flu Vaccine Information Statement, Inactivated Influenza Vaccine (01/02/2014) This information is not intended to replace advice given to you by your health care provider. Make sure you discuss any questions you have with your health care provider. Document Released: 03/09/2006 Document Revised: 02/03/2016 Document Reviewed: 02/03/2016 Elsevier Interactive Patient Education  2017 Elsevier Inc.  

## 2018-04-17 LAB — COMPREHENSIVE METABOLIC PANEL
A/G RATIO: 1.8 (ref 1.2–2.2)
ALT: 27 IU/L (ref 0–44)
AST: 24 IU/L (ref 0–40)
Albumin: 4.2 g/dL (ref 3.5–5.5)
Alkaline Phosphatase: 63 IU/L (ref 39–117)
BILIRUBIN TOTAL: 1 mg/dL (ref 0.0–1.2)
BUN / CREAT RATIO: 16 (ref 9–20)
BUN: 17 mg/dL (ref 6–24)
CHLORIDE: 98 mmol/L (ref 96–106)
CO2: 25 mmol/L (ref 20–29)
Calcium: 9.3 mg/dL (ref 8.7–10.2)
Creatinine, Ser: 1.06 mg/dL (ref 0.76–1.27)
GFR calc non Af Amer: 80 mL/min/{1.73_m2} (ref >59)
GFR, EST AFRICAN AMERICAN: 92 mL/min/{1.73_m2} (ref >59)
Globulin, Total: 2.3 g/dL (ref 1.5–4.5)
Glucose: 89 mg/dL (ref 65–99)
Potassium: 4.4 mmol/L (ref 3.5–5.2)
Sodium: 137 mmol/L (ref 134–144)
TOTAL PROTEIN: 6.5 g/dL (ref 6.0–8.5)

## 2018-04-22 ENCOUNTER — Encounter: Payer: Self-pay | Admitting: Gastroenterology

## 2018-04-28 HISTORY — PX: COLONOSCOPY: SHX174

## 2018-05-03 ENCOUNTER — Encounter: Payer: Self-pay | Admitting: Gastroenterology

## 2018-05-03 ENCOUNTER — Ambulatory Visit (AMBULATORY_SURGERY_CENTER): Payer: Self-pay | Admitting: *Deleted

## 2018-05-03 VITALS — Ht 66.0 in | Wt 148.0 lb

## 2018-05-03 DIAGNOSIS — Z1211 Encounter for screening for malignant neoplasm of colon: Secondary | ICD-10-CM

## 2018-05-03 NOTE — Progress Notes (Signed)
No egg or soy allergy  No trouble moving neck  No home oxygen used or hx of sleep apnea  Registered in [place]  No chewing tobacco/snuff used  No diet medications taken

## 2018-05-17 ENCOUNTER — Ambulatory Visit (AMBULATORY_SURGERY_CENTER): Payer: BLUE CROSS/BLUE SHIELD | Admitting: Gastroenterology

## 2018-05-17 ENCOUNTER — Encounter: Payer: Self-pay | Admitting: Gastroenterology

## 2018-05-17 VITALS — BP 107/71 | HR 64 | Temp 96.0°F | Resp 11 | Ht 65.0 in | Wt 146.0 lb

## 2018-05-17 DIAGNOSIS — K621 Rectal polyp: Secondary | ICD-10-CM

## 2018-05-17 DIAGNOSIS — K635 Polyp of colon: Secondary | ICD-10-CM | POA: Diagnosis not present

## 2018-05-17 DIAGNOSIS — Z1211 Encounter for screening for malignant neoplasm of colon: Secondary | ICD-10-CM | POA: Diagnosis not present

## 2018-05-17 DIAGNOSIS — D129 Benign neoplasm of anus and anal canal: Secondary | ICD-10-CM

## 2018-05-17 DIAGNOSIS — D128 Benign neoplasm of rectum: Secondary | ICD-10-CM

## 2018-05-17 DIAGNOSIS — D122 Benign neoplasm of ascending colon: Secondary | ICD-10-CM

## 2018-05-17 MED ORDER — SODIUM CHLORIDE 0.9 % IV SOLN: 500.0000 mL | Freq: Once | INTRAVENOUS | Status: DC

## 2018-05-17 NOTE — Op Note (Signed)
[facility] Patient Name: Theodore Rodriguez Procedure Date: 05/17/2018 9:05 AM MRN: [id number] Endoscopist: Thornton Park MD, MD Age: 52 Referring MD:  Date of Birth: 12/08/1965 Gender: Male Account #: [id number] Procedure:                Colonoscopy Indications:              Screening for colorectal malignant neoplasm, This                            is the patient's first colonoscopy. No known family                            history of colon cancer or polyps. No baseline GI                            symptoms. Medicines:                See the Anesthesia note for documentation of the                            administered medications Procedure:                Pre-Anesthesia Assessment:                           - Prior to the procedure, a History and Physical                            was performed, and patient medications and                            allergies were reviewed. The patient's tolerance of                            previous anesthesia was also reviewed. The risks                            and benefits of the procedure and the sedation                            options and risks were discussed with the patient.                            All questions were answered, and informed consent                            was obtained. Prior Anticoagulants: The patient has                            taken no previous anticoagulant or antiplatelet                            agents. ASA Grade Assessment: II - A patient with  mild systemic disease. After reviewing the risks                            and benefits, the patient was deemed in                            satisfactory condition to undergo the procedure.                           After obtaining informed consent, the colonoscope                            was passed under direct vision. Throughout the                            procedure, the patient's blood pressure, pulse, and                             oxygen saturations were monitored continuously. The                            Colonoscope was introduced through the anus and                            advanced to the the terminal ileum, with                            identification of the appendiceal orifice and IC                            valve. The terminal ileum, ileocecal valve,                            appendiceal orifice, and rectum were photographed. Scope In: 9:16:55 AM Scope Out: 9:28:30 AM Scope Withdrawal Time: 0 hours 9 minutes 3 seconds  Total Procedure Duration: 0 hours 11 minutes 35 seconds  Findings:                 The perianal and digital rectal examinations were                            normal.                           Non-bleeding internal hemorrhoids were found during                            endoscopy. The hemorrhoids were medium-sized.                           A 2 mm polyp was found in the ascending colon. The                            polyp was sessile. The polyp was removed with a  cold biopsy forceps. Resection and retrieval were                            complete. Estimated blood loss: none.                           A 1 mm polyp was found in the rectum. The polyp was                            sessile. The polyp was removed with a cold biopsy                            forceps. Resection and retrieval were complete.                           A few small-mouthed diverticula were found in the                            sigmoid colon.                           The exam was otherwise without abnormality on                            direct and retroflexion views. Complications:            No immediate complications. Estimated Blood Loss:     Estimated blood loss: none. Impression:               - Non-bleeding internal hemorrhoids.                           - One 2 mm polyp in the ascending colon, removed                            with a cold  biopsy forceps. Resected and retrieved.                           - One 1 mm polyp in the rectum, removed with a cold                            biopsy forceps. Resected and retrieved.                           - Mild diverticulosis in the sigmoid colon.                           - The examination was otherwise normal on direct                            and retroflexion views. Recommendation:           - Discharge patient to home.                           -  Resume previous diet today. High-fiber diet                            recommended.                           - Continue present medications.                           - Await pathology results.                           - Repeat colonoscopy in 5 years for surveillance if                            at least 1 of the polyps is adenomatous. Thornton Park MD, MD 05/17/2018 9:33:14 AM This report has been signed electronically.

## 2018-05-17 NOTE — Progress Notes (Signed)
Called to room to assist during endoscopic procedure.  Patient ID and intended procedure confirmed with present staff. Received instructions for my participation in the procedure from the performing physician.  

## 2018-05-17 NOTE — Patient Instructions (Signed)
Discharge instructions given. Handouts on polyps,diverticulosis and hemorrhoids. Resume previous medications. YOU HAD AN ENDOSCOPIC PROCEDURE TODAY AT THE [facility]:   Refer to the procedure report that was given to you for any specific questions about what was found during the examination.  If the procedure report does not answer your questions, please call your gastroenterologist to clarify.  If you requested that your care partner not be given the details of your procedure findings, then the procedure report has been included in a sealed envelope for you to review at your convenience later.  YOU SHOULD EXPECT: Some feelings of bloating in the abdomen. Passage of more gas than usual.  Walking can help get rid of the air that was put into your GI tract during the procedure and reduce the bloating. If you had a lower endoscopy (such as a colonoscopy or flexible sigmoidoscopy) you may notice spotting of blood in your stool or on the toilet paper. If you underwent a bowel prep for your procedure, you may not have a normal bowel movement for a few days.  Please Note:  You might notice some irritation and congestion in your nose or some drainage.  This is from the oxygen used during your procedure.  There is no need for concern and it should clear up in a day or so.  SYMPTOMS TO REPORT IMMEDIATELY:   Following lower endoscopy (colonoscopy or flexible sigmoidoscopy):  Excessive amounts of blood in the stool  Significant tenderness or worsening of abdominal pains  Swelling of the abdomen that is new, acute  Fever of 100F or higher   For urgent or emergent issues, a gastroenterologist can be reached at any hour by calling [phone].   DIET:  We do recommend a small meal at first, but then you may proceed to your regular diet.  Drink plenty of fluids but you should avoid alcoholic beverages for 24 hours.  ACTIVITY:  You should plan to take it easy for the rest of today and you  should NOT DRIVE or use heavy machinery until tomorrow (because of the sedation medicines used during the test).    FOLLOW UP: Our staff will call the number listed on your records the next business day following your procedure to check on you and address any questions or concerns that you may have regarding the information given to you following your procedure. If we do not reach you, we will leave a message.  However, if you are feeling well and you are not experiencing any problems, there is no need to return our call.  We will assume that you have returned to your regular daily activities without incident.  If any biopsies were taken you will be contacted by phone or by letter within the next 1-3 weeks.  Please call us at [phone] if you have not heard about the biopsies in 3 weeks.    SIGNATURES/CONFIDENTIALITY: You and/or your care partner have signed paperwork which will be entered into your electronic medical record.  These signatures attest to the fact that that the information above on your After Visit Summary has been reviewed and is understood.  Full responsibility of the confidentiality of this discharge information lies with you and/or your care-partner. 

## 2018-05-20 ENCOUNTER — Telehealth: Payer: Self-pay

## 2018-05-20 ENCOUNTER — Telehealth: Payer: Self-pay | Admitting: *Deleted

## 2018-05-20 NOTE — Telephone Encounter (Signed)
Patient returning nurses call. Patient states he is doing well and has no questions or concerns.

## 2018-05-20 NOTE — Telephone Encounter (Signed)
  Follow up Call-  Call back number 05/17/2018  Post procedure Call Back phone  # [phone]  or [phone]  Permission to leave phone message Yes  Some recent data might be hidden     Patient questions:  Do you have a fever, pain , or abdominal swelling? No. Pain Score  0 *  Have you tolerated food without any problems? Yes.    Have you been able to return to your normal activities? Yes.    Do you have any questions about your discharge instructions: Diet   No. Medications  No. Follow up visit  No.  Do you have questions or concerns about your Care? No.  Actions: * If pain score is 4 or above: No action needed, pain <4.  No problems noted per pt. maw

## 2018-05-20 NOTE — Telephone Encounter (Signed)
First attempt, left VM.  

## 2018-05-24 ENCOUNTER — Encounter: Payer: Self-pay | Admitting: Gastroenterology

## 2018-06-11 ENCOUNTER — Ambulatory Visit (INDEPENDENT_AMBULATORY_CARE_PROVIDER_SITE_OTHER): Payer: BLUE CROSS/BLUE SHIELD | Admitting: Family Medicine

## 2018-06-11 ENCOUNTER — Encounter: Payer: Self-pay | Admitting: Family Medicine

## 2018-06-11 VITALS — BP 114/77 | HR 87 | Temp 98.5°F | Resp 17 | Ht 65.0 in | Wt 147.0 lb

## 2018-06-11 DIAGNOSIS — R0982 Postnasal drip: Secondary | ICD-10-CM

## 2018-06-11 DIAGNOSIS — J0100 Acute maxillary sinusitis, unspecified: Secondary | ICD-10-CM | POA: Diagnosis not present

## 2018-06-11 MED ORDER — FLUTICASONE PROPIONATE 50 MCG/ACT NA SUSP: 2.0000 | Freq: Every day | NASAL | 6 refills | Status: DC

## 2018-06-11 MED ORDER — AMOXICILLIN-POT CLAVULANATE 875-125 MG PO TABS: 1.0000 | ORAL_TABLET | Freq: Two times a day (BID) | ORAL | 0 refills | Status: DC

## 2018-06-11 NOTE — Patient Instructions (Addendum)
   If you have lab work done today you will be contacted with your lab results within the next 2 weeks.  If you have not heard from us then please contact us. The fastest way to get your results is to register for My Chart.   IF you received an x-ray today, you will receive an invoice from [facility]. Please contact [facility] at [phone] with questions or concerns regarding your invoice.   IF you received labwork today, you will receive an invoice from [facility]. Please contact [facility] at [phone] with questions or concerns regarding your invoice.   Our billing staff will not be able to assist you with questions regarding bills from these companies.  You will be contacted with the lab results as soon as they are available. The fastest way to get your results is to activate your My Chart account. Instructions are located on the last page of this paperwork. If you have not heard from us regarding the results in 2 weeks, please contact this office.      Postnasal Drip Postnasal drip is the feeling of mucus going down the back of your throat. Mucus is a slimy substance that moistens and cleans your nose and throat, as well as the air pockets in face bones near your forehead and cheeks (sinuses). Small amounts of mucus pass from your nose and sinuses down the back of your throat all the time. This is normal. When you produce too much mucus or the mucus gets too thick, you can feel it. Some common causes of postnasal drip include:  Having more mucus because of: ? A cold or the flu. ? Allergies. ? Cold air. ? Certain medicines.  Having more mucus that is thicker because of: ? A sinus or nasal infection. ? Dry air. ? A food allergy. Follow these instructions at home: Relieving discomfort   Gargle with a salt-water mixture 3-4 times a day or as needed. To make a salt-water mixture, completely dissolve -1 tsp of salt in 1 cup of warm water.  If the air in  your home is dry, use a humidifier to add moisture to the air.  Use a saline spray or container (neti pot) to flush out the nose (nasal irrigation). These methods can help clear away mucus and keep the nasal passages moist. General instructions  Take over-the-counter and prescription medicines only as told by your health care provider.  Follow instructions from your health care provider about eating or drinking restrictions. You may need to avoid caffeine.  Avoid things that you know you are allergic to (allergens), like dust, mold, pollen, pets, or certain foods.  Drink enough fluid to keep your urine pale yellow.  Keep all follow-up visits as told by your health care provider. This is important. Contact a health care provider if:  You have a fever.  You have a sore throat.  You have difficulty swallowing.  You have headache.  You have sinus pain.  You have a cough that does not go away.  The mucus from your nose becomes thick and is green or yellow in color.  You have cold or flu symptoms that last more than 10 days. Summary  Postnasal drip is the feeling of mucus going down the back of your throat.  If your health care provider approves, use nasal irrigation or a nasal spray 2?4 times a day.  Avoid things that you know you are allergic to (allergens), like dust, mold, pollen, pets, or certain   foods. This information is not intended to replace advice given to you by your health care provider. Make sure you discuss any questions you have with your health care provider. Document Released: 08/28/2016 Document Revised: 08/28/2016 Document Reviewed: 08/28/2016 Elsevier Interactive Patient Education  2019 [employer]  

## 2018-06-11 NOTE — Progress Notes (Signed)
Established Patient Office Visit  Subjective:  Patient ID: Theodore Rodriguez, male    DOB: [birth year]  Age: 53 y.o. MRN: [id number]  CC:  Chief Complaint  Patient presents with  . Cough  . URI  . Nasal Congestion    HPI [name] presents for 4 days history of sinus drainage and postnasal drip with coughing and yellow mucus which caused him to vomit He took tylenol and therapflu He continues to have congestion His cough is better but the drainage is still present and the mucus is still yellow He reports that he had some fevers overnight but did not check not check his temperature He took tylenol this morning He did not take his bp medications this morning because he did not eat    Past Medical History:  Diagnosis Date  . Diabetes mellitus without complication ([place])   . Hypertension     History reviewed. No pertinent surgical history.  Family History  Problem Relation Age of Onset  . Hypertension Mother   . Colon cancer Maternal Aunt   . Colon cancer Maternal Uncle   . Esophageal cancer Neg Hx   . Stomach cancer Neg Hx   . Rectal cancer Neg Hx     Social History   Socioeconomic History  . Marital status: Married    Spouse name: Not on file  . Number of children: Not on file  . Years of education: Not on file  . Highest education level: Not on file  Occupational History  . Not on file  Social Needs  . Financial resource strain: Not on file  . Food insecurity:    Worry: Not on file    Inability: Not on file  . Transportation needs:    Medical: Not on file    Non-medical: Not on file  Tobacco Use  . Smoking status: Never Smoker  . Smokeless tobacco: Never Used  Substance and Sexual Activity  . Alcohol use: Yes    Comment: occasionally  . Drug use: No  . Sexual activity: Not on file  Lifestyle  . Physical activity:    Days per week: Not on file    Minutes per session: Not on file  . Stress: Not on file  Relationships  . Social connections:   Talks on phone: Not on file    Gets together: Not on file    Attends religious service: Not on file    Active member of club or organization: Not on file    Attends meetings of clubs or organizations: Not on file    Relationship status: Not on file  . Intimate partner violence:    Fear of current or ex partner: Not on file    Emotionally abused: Not on file    Physically abused: Not on file    Forced sexual activity: Not on file  Other Topics Concern  . Not on file  Social History Narrative  . Not on file    Outpatient Medications Prior to Visit  Medication Sig Dispense Refill  . cyclobenzaprine (FLEXERIL) 5 MG tablet Take 1 tablet (5 mg total) by mouth 3 (three) times daily as needed for muscle spasms. 30 tablet 0  . hydrOXYzine (ATARAX/VISTARIL) 25 MG tablet Take 1 tablet (25 mg total) by mouth every 6 (six) hours as needed for anxiety. 12 tablet 0  . ibuprofen (ADVIL,MOTRIN) 600 MG tablet Take 1 tablet (600 mg total) by mouth every 8 (eight) hours as needed. 30 tablet 0  . metFORMIN (GLUCOPHAGE)  500 MG tablet Take 1 tablet (500 mg total) by mouth daily with breakfast. 90 tablet 3  . valsartan-hydrochlorothiazide (DIOVAN-HCT) 160-25 MG tablet Take 1 tablet by mouth daily. 90 tablet 1   No facility-administered medications prior to visit.     No Known Allergies  ROS Review of Systems    Objective:    Physical Exam  BP 114/77   Pulse 87   Temp 98.5 F (36.9 C) (Oral)   Resp 17   Ht 5\' 5"  (1.651 m)   Wt 147 lb (66.7 kg)   SpO2 98%   BMI 24.46 kg/m  Wt Readings from Last 3 Encounters:  06/11/18 147 lb (66.7 kg)  05/17/18 146 lb (66.2 kg)  05/03/18 148 lb (67.1 kg)   General: alert, oriented, in NAD Head: normocephalic, atraumatic, + sinus tenderness Eyes: EOM intact, no scleral icterus or conjunctival injection Ears: TM clear bilaterally Nose: mucosa erythematous and edematous Throat: no pharyngeal exudate or erythema Lymph: no posterior auricular, submental  or cervical lymph adenopathy Heart: normal rate, normal sinus rhythm, no murmurs Lungs: clear to auscultation bilaterally, no wheezing    Assessment & Plan:   Problem List Items Addressed This Visit    None    Visit Diagnoses    Acute non-recurrent maxillary sinusitis    -  Primary   Relevant Medications   amoxicillin-clavulanate (AUGMENTIN) 875-125 MG tablet   fluticasone (FLONASE) 50 MCG/ACT nasal spray   Postnasal drip         Advised augmentin for sinusitis flonase  Cough medication Rest and hydration  Work note given   Meds ordered this encounter  Medications  . amoxicillin-clavulanate (AUGMENTIN) 875-125 MG tablet    Sig: Take 1 tablet by mouth 2 (two) times daily.    Dispense:  20 tablet    Refill:  0  . fluticasone (FLONASE) 50 MCG/ACT nasal spray    Sig: Place 2 sprays into both nostrils daily.    Dispense:  16 g    Refill:  6    Follow-up: Return if symptoms worsen or fail to improve.    Forrest Moron, MD

## 2018-07-14 ENCOUNTER — Other Ambulatory Visit: Payer: Self-pay | Admitting: Family Medicine

## 2018-07-14 DIAGNOSIS — I10 Essential (primary) hypertension: Secondary | ICD-10-CM

## 2018-07-15 NOTE — Telephone Encounter (Signed)
Requested Prescriptions  Pending Prescriptions Disp Refills  . valsartan-hydrochlorothiazide (DIOVAN-HCT) 160-25 MG tablet [Pharmacy Med Name: VALSARTAN-HCTZ 160-25 MG TAB] 90 tablet 0    Sig: TAKE 1 TABLET BY MOUTH EVERY DAY     Cardiovascular: ARB + Diuretic Combos Passed - 07/14/2018 12:13 AM      Passed - K in normal range and within 180 days    Potassium  Date Value Ref Range Status  04/16/2018 4.4 3.5 - 5.2 mmol/L Final         Passed - Na in normal range and within 180 days    Sodium  Date Value Ref Range Status  04/16/2018 137 134 - 144 mmol/L Final         Passed - Cr in normal range and within 180 days    Creatinine, Ser  Date Value Ref Range Status  04/16/2018 1.06 0.76 - 1.27 mg/dL Final         Passed - Ca in normal range and within 180 days    Calcium  Date Value Ref Range Status  04/16/2018 9.3 8.7 - 10.2 mg/dL Final         Passed - Patient is not pregnant      Passed - Last BP in normal range    BP Readings from Last 1 Encounters:  06/11/18 114/77         Passed - Valid encounter within last 6 months    Recent Outpatient Visits          1 month ago Acute non-recurrent maxillary sinusitis   [facility] at [facility], Arlie Solomons, MD   3 months ago Essential hypertension   [facility] at [facility] [place] A, MD   5 months ago Acute right-sided thoracic back pain   [facility] at [place], [place]   6 months ago Essential hypertension   Primary Care at [facility], Arlie Solomons, MD   1 year ago Nonspecific abnormal electrocardiogram (ECG) (EKG)   Primary Care at [place], MD      Future Appointments            In 3 months Forrest Moron, MD Primary Care at [place]

## 2018-10-13 ENCOUNTER — Other Ambulatory Visit: Payer: Self-pay | Admitting: Family Medicine

## 2018-10-13 DIAGNOSIS — I10 Essential (primary) hypertension: Secondary | ICD-10-CM

## 2018-10-16 ENCOUNTER — Encounter: Payer: BLUE CROSS/BLUE SHIELD | Admitting: Family Medicine

## 2018-11-04 ENCOUNTER — Other Ambulatory Visit: Payer: Self-pay

## 2018-11-04 ENCOUNTER — Ambulatory Visit (INDEPENDENT_AMBULATORY_CARE_PROVIDER_SITE_OTHER): Payer: BC Managed Care – PPO | Admitting: Family Medicine

## 2018-11-04 ENCOUNTER — Other Ambulatory Visit (HOSPITAL_COMMUNITY)
Admission: RE | Admit: 2018-11-04 | Discharge: 2018-11-04 | Disposition: A | Discharge status: Home / Self Care | Payer: BC Managed Care – PPO | Source: Ambulatory Visit | Attending: Family Medicine | Admitting: Family Medicine

## 2018-11-04 ENCOUNTER — Encounter: Payer: Self-pay | Admitting: Family Medicine

## 2018-11-04 VITALS — BP 102/65 | HR 95 | Temp 98.4°F | Resp 16 | Ht 65.0 in | Wt 148.4 lb

## 2018-11-04 DIAGNOSIS — R7303 Prediabetes: Secondary | ICD-10-CM | POA: Diagnosis not present

## 2018-11-04 DIAGNOSIS — Z113 Encounter for screening for infections with a predominantly sexual mode of transmission: Secondary | ICD-10-CM | POA: Diagnosis not present

## 2018-11-04 DIAGNOSIS — Z125 Encounter for screening for malignant neoplasm of prostate: Secondary | ICD-10-CM | POA: Diagnosis not present

## 2018-11-04 DIAGNOSIS — L603 Nail dystrophy: Secondary | ICD-10-CM

## 2018-11-04 DIAGNOSIS — I10 Essential (primary) hypertension: Secondary | ICD-10-CM

## 2018-11-04 DIAGNOSIS — Z114 Encounter for screening for human immunodeficiency virus [HIV]: Secondary | ICD-10-CM

## 2018-11-04 DIAGNOSIS — Z0001 Encounter for general adult medical examination with abnormal findings: Secondary | ICD-10-CM | POA: Diagnosis not present

## 2018-11-04 DIAGNOSIS — Z0000 Encounter for general adult medical examination without abnormal findings: Secondary | ICD-10-CM

## 2018-11-04 LAB — POCT GLYCOSYLATED HEMOGLOBIN (HGB A1C): Hemoglobin A1C: 6.2 % — AB (ref 4.0–5.6)

## 2018-11-04 MED ORDER — FLUTICASONE PROPIONATE 50 MCG/ACT NA SUSP: 2.0000 | Freq: Every day | NASAL | 6 refills | Status: DC

## 2018-11-04 NOTE — Progress Notes (Signed)
Chief Complaint  Patient presents with  . Annual Exam  . Medication Refill    flonase    Subjective:  Theodore Rodriguez is a 53 y.o. male here for a health maintenance visit.  Patient is established pt  Nail fungus Patient reports that he has gray thick nails that are painful Worse at the great toe on both feet  He denies itching of the feet or toes He does not wear work boots and keeps his feet dry.  His toes have been this way for 10 years  Hypertension: Patient here for follow-up of elevated blood pressure. He is exercising and is adherent to low salt diet.  Blood pressure is well controlled at home. Cardiac symptoms none. Patient denies chest pain, chest pressure/discomfort, claudication, exertional chest pressure/discomfort, fatigue, irregular heart beat, lower extremity edema and near-syncope.  Cardiovascular risk factors: hypertension and male gender. Use of agents associated with hypertension: none. History of target organ damage: none. BP Readings from Last 3 Encounters:  11/04/18 102/65  06/11/18 114/77  05/17/18 107/71     Patient Active Problem List   Diagnosis Date Noted  . Essential hypertension 05/09/2017  . Nonspecific abnormal electrocardiogram (ECG) (EKG) 05/09/2017  . Prediabetes 05/09/2017    Past Medical History:  Diagnosis Date  . Diabetes mellitus without complication ([place])   . Hypertension     No past surgical history on file.   Outpatient Medications Prior to Visit  Medication Sig Dispense Refill  . fluticasone (FLONASE) 50 MCG/ACT nasal spray Place 2 sprays into both nostrils daily. 16 g 6  . metFORMIN (GLUCOPHAGE) 500 MG tablet Take 1 tablet (500 mg total) by mouth daily with breakfast. 90 tablet 3  . valsartan-hydrochlorothiazide (DIOVAN-HCT) 160-25 MG tablet TAKE 1 TABLET BY MOUTH EVERY DAY 30 tablet 0  . amoxicillin-clavulanate (AUGMENTIN) 875-125 MG tablet Take 1 tablet by mouth 2 (two) times daily. (Patient not taking: Reported on 11/04/2018) 20  tablet 0  . cyclobenzaprine (FLEXERIL) 5 MG tablet Take 1 tablet (5 mg total) by mouth 3 (three) times daily as needed for muscle spasms. (Patient not taking: Reported on 11/04/2018) 30 tablet 0  . hydrOXYzine (ATARAX/VISTARIL) 25 MG tablet Take 1 tablet (25 mg total) by mouth every 6 (six) hours as needed for anxiety. (Patient not taking: Reported on 11/04/2018) 12 tablet 0  . ibuprofen (ADVIL,MOTRIN) 600 MG tablet Take 1 tablet (600 mg total) by mouth every 8 (eight) hours as needed. (Patient not taking: Reported on 11/04/2018) 30 tablet 0   No facility-administered medications prior to visit.     No Known Allergies   Family History  Problem Relation Age of Onset  . Hypertension Mother   . Colon cancer Maternal Aunt   . Colon cancer Maternal Uncle   . Esophageal cancer Neg Hx   . Stomach cancer Neg Hx   . Rectal cancer Neg Hx      Health Habits: Dental Exam:not up to date Eye Exam: not up to date Exercise: 0 times/week on average Current exercise activities: walking/running Diet: balanced  Social History   Socioeconomic History  . Marital status: Married    Spouse name: Not on file  . Number of children: Not on file  . Years of education: Not on file  . Highest education level: Not on file  Occupational History  . Not on file  Social Needs  . Financial resource strain: Not on file  . Food insecurity:    Worry: Not on file    Inability: Not  on file  . Transportation needs:    Medical: Not on file    Non-medical: Not on file  Tobacco Use  . Smoking status: Never Smoker  . Smokeless tobacco: Never Used  Substance and Sexual Activity  . Alcohol use: Yes    Comment: occasionally  . Drug use: No  . Sexual activity: Not on file  Lifestyle  . Physical activity:    Days per week: Not on file    Minutes per session: Not on file  . Stress: Not on file  Relationships  . Social connections:    Talks on phone: Not on file    Gets together: Not on file    Attends  religious service: Not on file    Active member of club or organization: Not on file    Attends meetings of clubs or organizations: Not on file    Relationship status: Not on file  . Intimate partner violence:    Fear of current or ex partner: Not on file    Emotionally abused: Not on file    Physically abused: Not on file    Forced sexual activity: Not on file  Other Topics Concern  . Not on file  Social History Narrative  . Not on file   Social History   Substance and Sexual Activity  Alcohol Use Yes   Comment: occasionally   Social History   Tobacco Use  Smoking Status Never Smoker  Smokeless Tobacco Never Used   Social History   Substance and Sexual Activity  Drug Use No     Health Maintenance: See under health Maintenance activity for review of completion dates as well. Immunization History  Administered Date(s) Administered  . Influenza Split 03/12/2013  . Influenza,inj,Quad PF,6+ Mos 05/09/2017, 04/16/2018  . Tdap 01/17/2015      Depression Screen-PHQ2/9 Depression screen [facility] 2/9 11/04/2018 06/11/2018 04/16/2018 01/29/2018 01/02/2018  Decreased Interest 0 0 0 0 0  Down, Depressed, Hopeless 0 0 0 0 0  PHQ - 2 Score 0 0 0 0 0  Altered sleeping - 0 - - -  Tired, decreased energy - 0 - - -  Change in appetite - 0 - - -  Feeling bad or failure about yourself  - 0 - - -  Trouble concentrating - 0 - - -  Moving slowly or fidgety/restless - 0 - - -  Suicidal thoughts - 0 - - -  PHQ-9 Score - 0 - - -  Difficult doing work/chores - Not difficult at all - - -       Depression Severity and Treatment Recommendations:  0-4= None  5-9= Mild / Treatment: Support, educate to call if worse; return in one month  10-14= Moderate / Treatment: Support, watchful waiting; Antidepressant or Psycotherapy  15-19= Moderately severe / Treatment: Antidepressant OR Psychotherapy  >= 20 = Major depression, severe / Antidepressant AND Psychotherapy    Review of Systems   ROS   See HPI for ROS as well.   Review of Systems  Constitutional: Negative for activity change, appetite change, chills and fever.  HENT: Negative for congestion, nosebleeds, trouble swallowing and voice change.   Respiratory: Negative for cough, shortness of breath and wheezing.   Gastrointestinal: Negative for diarrhea, nausea and vomiting.  Genitourinary: Negative for difficulty urinating, dysuria, flank pain and hematuria.  Musculoskeletal: Negative for back pain, joint swelling and neck pain.  Neurological: Negative for dizziness, speech difficulty, light-headedness and numbness.  See HPI. All other review of systems negative.  Objective:   Vitals:   11/04/18 0910  BP: 102/65  Pulse: 95  Resp: 16  Temp: 98.4 F (36.9 C)  TempSrc: Oral  SpO2: 96%  Weight: 148 lb 6.4 oz (67.3 kg)  Height: '5\' 5"'  (1.651 m)    Body mass index is 24.7 kg/m.  Physical Exam Exam conducted with a chaperone present.  Constitutional:      Appearance: Normal appearance. He is normal weight.  HENT:     Head: Normocephalic and atraumatic.     Right Ear: Tympanic membrane normal.     Left Ear: Tympanic membrane normal.     Nose: Nose normal.  Eyes:     Extraocular Movements: Extraocular movements intact.     Conjunctiva/sclera: Conjunctivae normal.  Neck:     Musculoskeletal: Normal range of motion and neck supple.  Cardiovascular:     Rate and Rhythm: Normal rate and regular rhythm.     Pulses: Normal pulses.     Heart sounds: No murmur.  Pulmonary:     Effort: Pulmonary effort is normal. No respiratory distress.     Breath sounds: Normal breath sounds. No stridor. No wheezing or rhonchi.  Abdominal:     General: Abdomen is flat. Bowel sounds are normal. There is no distension.     Palpations: Abdomen is soft. There is no mass.     Tenderness: There is no abdominal tenderness. There is no right CVA tenderness, left CVA tenderness, guarding or rebound.     Hernia: No hernia is present.   Genitourinary:    Penis: Normal.      Scrotum/Testes: Normal.     Prostate: Normal. Not enlarged, not tender and no nodules present.     Rectum: Normal. No tenderness, anal fissure, external hemorrhoid or internal hemorrhoid. Normal anal tone.  Musculoskeletal: Normal range of motion.        General: No swelling or tenderness.  Skin:    General: Skin is warm.     Capillary Refill: Capillary refill takes less than 2 seconds.     Coloration: Skin is not jaundiced or pale.     Findings: No bruising or erythema.  Neurological:     General: No focal deficit present.     Mental Status: He is alert and oriented to person, place, and time. Mental status is at baseline.  Psychiatric:        Mood and Affect: Mood normal.        Behavior: Behavior normal.        Thought Content: Thought content normal.        Judgment: Judgment normal.         Assessment/Plan:   Patient was seen for a health maintenance exam.  Counseled the patient on health maintenance issues. Reviewed her health mainteance schedule and ordered appropriate tests (see orders.) Counseled on regular exercise and weight management. Recommend regular eye exams and dental cleaning.   The following issues were addressed today for health maintenance:   Cabe was seen today for annual exam and medication refill.  Diagnoses and all orders for this visit:  Encounter for health maintenance examination in adult-  Health Maintenance Plan Advised monthly testicle exam and annual check up Advised dental exam every six months Discussed stress management  Prediabetes- stable, encouraged low starch diet -     CMP14+EGFR -     Lipid panel  Screening for HIV (human immunodeficiency virus) -     POCT glycosylated hemoglobin (Hb A1C) -  HIV antibody (with reflex)  Essential hypertension- very good control, discussed that with a good exercise regimen will begin to decrease his medication by half  -     CMP14+EGFR -     Lipid  panel  Screening for malignant neoplasm of prostate -     PSA  Screen for STD (sexually transmitted disease) -     GC/Chlamydia probe amp ([place])not at [facility] -     Hepatitis B surface antigen -     RPR  Onychodystrophy    No follow-ups on file.    Body mass index is 24.7 kg/m.:  Discussed the patient's BMI with patient. The BMI body mass index is 24.7 kg/m.     No future appointments.  Patient Instructions       If you have lab work done today you will be contacted with your lab results within the next 2 weeks.  If you have not heard from [place] then please contact us. The fastest way to get your results is to register for My Chart.   IF you received an x-ray today, you will receive an invoice from [facility]. Please contact [facility] at [phone] with questions or concerns regarding your invoice.   IF you received labwork today, you will receive an invoice from [facility]. Please contact [facility] at [phone] with questions or concerns regarding your invoice.   Our billing staff will not be able to assist you with questions regarding bills from these companies.  You will be contacted with the lab results as soon as they are available. The fastest way to get your results is to activate your My Chart account. Instructions are located on the last page of this paperwork. If you have not heard from [place] regarding the results in 2 weeks, please contact this office.     Health Maintenance, Male A healthy lifestyle and preventive care is important for your health and wellness. Ask your health care provider about what schedule of regular examinations is right for you. What should I know about weight and diet? Eat a Healthy Diet  Eat plenty of vegetables, fruits, whole grains, low-fat dairy products, and lean protein.  Do not eat a lot of foods high in solid fats, added sugars, or salt.  Maintain a Healthy Weight Regular exercise can help you  achieve or maintain a healthy weight. You should:  Do at least 150 minutes of exercise each week. The exercise should increase your heart rate and make you sweat (moderate-intensity exercise).  Do strength-training exercises at least twice a week. Watch Your Levels of Cholesterol and Blood Lipids  Have your blood tested for lipids and cholesterol every 5 years starting at 54 years of age. If you are at high risk for heart disease, you should start having your blood tested when you are 54 years old. You may need to have your cholesterol levels checked more often if: ? Your lipid or cholesterol levels are high. ? You are older than 54 years of age. ? You are at high risk for heart disease. What should I know about cancer screening? Many types of cancers can be detected early and may often be prevented. Lung Cancer  You should be screened every year for lung cancer if: ? You are a current smoker who has smoked for at least 30 years. ? You are a former smoker who has quit within the past 15 years.  Talk to your health care provider about your screening options, when you should  start screening, and how often you should be screened. Colorectal Cancer  Routine colorectal cancer screening usually begins at 54 years of age and should be repeated every 5-10 years until you are 54 years old. You may need to be screened more often if early forms of precancerous polyps or small growths are found. Your health care provider may recommend screening at an earlier age if you have risk factors for colon cancer.  Your health care provider may recommend using home test kits to check for hidden blood in the stool.  A small camera at the end of a tube can be used to examine your colon (sigmoidoscopy or colonoscopy). This checks for the earliest forms of colorectal cancer. Prostate and Testicular Cancer  Depending on your age and overall health, your health care provider may do certain tests to screen for  prostate and testicular cancer.  Talk to your health care provider about any symptoms or concerns you have about testicular or prostate cancer. Skin Cancer  Check your skin from head to toe regularly.  Tell your health care provider about any new moles or changes in moles, especially if: ? There is a change in a mole's size, shape, or color. ? You have a mole that is larger than a pencil eraser.  Always use sunscreen. Apply sunscreen liberally and repeat throughout the day.  Protect yourself by wearing long sleeves, pants, a wide-brimmed hat, and sunglasses when outside. What should I know about heart disease, diabetes, and high blood pressure?  If you are 34-23 years of age, have your blood pressure checked every 3-5 years. If you are 54 years of age or older, have your blood pressure checked every year. You should have your blood pressure measured twice-once when you are at a hospital or clinic, and once when you are not at a hospital or clinic. Record the average of the two measurements. To check your blood pressure when you are not at a hospital or clinic, you can use: ? An automated blood pressure machine at a pharmacy. ? A home blood pressure monitor.  Talk to your health care provider about your target blood pressure.  If you are between 27-78 years old, ask your health care provider if you should take aspirin to prevent heart disease.  Have regular diabetes screenings by checking your fasting blood sugar level. ? If you are at a normal weight and have a low risk for diabetes, have this test once every three years after the age of 69. ? If you are overweight and have a high risk for diabetes, consider being tested at a younger age or more often.  A one-time screening for abdominal aortic aneurysm (AAA) by ultrasound is recommended for men aged 74-75 years who are current or former smokers. What should I know about preventing infection? Hepatitis B If you have a higher risk for  hepatitis B, you should be screened for this virus. Talk with your health care provider to find out if you are at risk for hepatitis B infection. Hepatitis C Blood testing is recommended for:  Everyone born from [birth year] through 1966.  Anyone with known risk factors for hepatitis C. Sexually Transmitted Diseases (STDs)  You should be screened each year for STDs including gonorrhea and chlamydia if: ? You are sexually active and are younger than 54 years of age. ? You are older than 54 years of age and your health care provider tells you that you are at risk for this type of infection. ?  Your sexual activity has changed since you were last screened and you are at an increased risk for chlamydia or gonorrhea. Ask your health care provider if you are at risk.  Talk with your health care provider about whether you are at high risk of being infected with HIV. Your health care provider may recommend a prescription medicine to help prevent HIV infection. What else can I do?  Schedule regular health, dental, and eye exams.  Stay current with your vaccines (immunizations).  Do not use any tobacco products, such as cigarettes, chewing tobacco, and e-cigarettes. If you need help quitting, ask your health care provider.  Limit alcohol intake to no more than 2 drinks per day. One drink equals 12 ounces of beer, 5 ounces of wine, or 1 ounces of hard liquor.  Do not use street drugs.  Do not share needles.  Ask your health care provider for help if you need support or information about quitting drugs.  Tell your health care provider if you often feel depressed.  Tell your health care provider if you have ever been abused or do not feel safe at home. This information is not intended to replace advice given to you by your health care provider. Make sure you discuss any questions you have with your health care provider. Document Released: 11/11/2007 Document Revised: 01/12/2016 Document Reviewed:  02/16/2015 Elsevier Interactive Patient Education  2019 [employer].

## 2018-11-04 NOTE — Patient Instructions (Addendum)

## 2018-11-05 LAB — CMP14+EGFR
ALT: 24 IU/L (ref 0–44)
AST: 19 IU/L (ref 0–40)
Albumin/Globulin Ratio: 1.7 (ref 1.2–2.2)
Albumin: 4.3 g/dL (ref 3.8–4.9)
Alkaline Phosphatase: 75 IU/L (ref 39–117)
BUN/Creatinine Ratio: 22 — ABNORMAL HIGH (ref 9–20)
BUN: 25 mg/dL — ABNORMAL HIGH (ref 6–24)
Bilirubin Total: 0.3 mg/dL (ref 0.0–1.2)
CO2: 18 mmol/L — ABNORMAL LOW (ref 20–29)
Calcium: 9 mg/dL (ref 8.7–10.2)
Chloride: 100 mmol/L (ref 96–106)
Creatinine, Ser: 1.16 mg/dL (ref 0.76–1.27)
GFR calc Af Amer: 83 mL/min/{1.73_m2} (ref >59)
GFR calc non Af Amer: 71 mL/min/{1.73_m2} (ref >59)
Globulin, Total: 2.5 g/dL (ref 1.5–4.5)
Glucose: 123 mg/dL — ABNORMAL HIGH (ref 65–99)
Potassium: 4.3 mmol/L (ref 3.5–5.2)
Sodium: 134 mmol/L (ref 134–144)
Total Protein: 6.8 g/dL (ref 6.0–8.5)

## 2018-11-05 LAB — LIPID PANEL
Chol/HDL Ratio: 2.4 ratio (ref 0.0–5.0)
Cholesterol, Total: 169 mg/dL (ref 100–199)
HDL: 69 mg/dL (ref >39)
LDL Calculated: 80 mg/dL (ref 0–99)
Triglycerides: 99 mg/dL (ref 0–149)
VLDL Cholesterol Cal: 20 mg/dL (ref 5–40)

## 2018-11-05 LAB — GC/CHLAMYDIA PROBE AMP (~~LOC~~) NOT AT ARMC
Chlamydia: NEGATIVE
Neisseria Gonorrhea: NEGATIVE

## 2018-11-05 LAB — PSA: Prostate Specific Ag, Serum: 2.3 ng/mL (ref 0.0–4.0)

## 2018-11-05 LAB — RPR: RPR Ser Ql: NONREACTIVE

## 2018-11-05 LAB — HEPATITIS B SURFACE ANTIGEN: Hepatitis B Surface Ag: NEGATIVE

## 2018-11-05 LAB — HIV ANTIBODY (ROUTINE TESTING W REFLEX): HIV Screen 4th Generation wRfx: NONREACTIVE

## 2018-11-07 MED ORDER — VALSARTAN-HYDROCHLOROTHIAZIDE 160-25 MG PO TABS: 0.5000 | ORAL_TABLET | Freq: Every day | ORAL | 1 refills | Status: DC

## 2018-11-07 MED ORDER — METFORMIN HCL 500 MG PO TABS: 500.0000 mg | ORAL_TABLET | Freq: Two times a day (BID) | ORAL | 1 refills | Status: DC

## 2018-11-13 ENCOUNTER — Encounter: Payer: Self-pay | Admitting: Podiatry

## 2018-11-13 ENCOUNTER — Other Ambulatory Visit: Payer: Self-pay

## 2018-11-13 ENCOUNTER — Ambulatory Visit (INDEPENDENT_AMBULATORY_CARE_PROVIDER_SITE_OTHER): Payer: BC Managed Care – PPO | Admitting: Podiatry

## 2018-11-13 VITALS — Temp 97.4°F

## 2018-11-13 DIAGNOSIS — B351 Tinea unguium: Secondary | ICD-10-CM

## 2018-11-13 DIAGNOSIS — L600 Ingrowing nail: Secondary | ICD-10-CM | POA: Diagnosis not present

## 2018-11-13 MED ORDER — HYDROCODONE-ACETAMINOPHEN 10-325 MG PO TABS: 1.0000 | ORAL_TABLET | Freq: Three times a day (TID) | ORAL | 0 refills | Status: AC | PRN

## 2018-11-13 MED ORDER — NEOMYCIN-POLYMYXIN-HC 3.5-10000-1 OT SOLN: OTIC | 0 refills | Status: DC

## 2018-11-13 NOTE — Progress Notes (Signed)
Subjective:   Patient ID: Theodore Rodriguez, male   DOB: 53 y.o.   MRN: [id number]   HPI Patient presents with damaged nailbeds of both feet stating that they make it hard to wear shoe gear.  States is been getting gradually worse over time and making it increasingly difficult to wear shoe gear comfortably and they get thick and both the right and the left foot hurt.  Patient does not smoke and likes to be active   Review of Systems  All other systems reviewed and are negative.       Objective:  Physical Exam Vitals signs and nursing note reviewed.  Constitutional:      Appearance: He is well-developed.  Pulmonary:     Effort: Pulmonary effort is normal.  Musculoskeletal: Normal range of motion.  Skin:    General: Skin is warm.  Neurological:     Mental Status: He is alert.     Neurovascular status found to be intact muscle strength was adequate range of motion within normal limits.  I noted the patient had thickened nailbeds 1 through 5 of both the with the hallux second and third nails being very dystrophic thickened and painful with palpation.  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  I do think at this point due to the thickness and his inability to take care of himself including pain nail removal would be best for him and he agrees.  We will do 1 foot at a time and I educated him on nail removal and at this time working to do the right foot and I infiltrated with 240 mg Xylocaine after he read and signed consent form understanding permanent correction.  I then under sterile conditions removed the hallux second and third nails exposed matrix and applied phenol 5 applications to the right hallux and for applications of the second and third digit followed by alcohol  lavage and sterile dressing.  I gave instructions on soaks and I wrote prescription for drops to reduce drainage and instructed to leave dressing on 24 hours but take it off earlier if needed and also wrote prescription for Vicodin 02/29/2024 for any postoperative pain.  He will be off of work for several days and reappoint to have his left foot fixed in the next 3 weeks signed visit

## 2018-11-13 NOTE — Patient Instructions (Signed)

## 2018-11-18 ENCOUNTER — Encounter: Payer: Self-pay | Admitting: Podiatry

## 2018-11-20 ENCOUNTER — Telehealth: Payer: Self-pay | Admitting: Podiatry

## 2018-11-20 ENCOUNTER — Encounter: Payer: Self-pay | Admitting: Podiatry

## 2018-11-20 NOTE — Telephone Encounter (Signed)
Lindsay:  Dr. Paulla Dolly said it was okay for the patient to be out of work until 11/25/18.  Thank you, Juliann Pulse

## 2018-11-20 NOTE — Telephone Encounter (Signed)
Pt was seen in office on 11/13/18 and had a nail removed. Pt was written out of work until 11/18/18 and called this morning requesting an updated letter to write him out of work until Monday, 11/25/18. Pt states he is having trouble wearing his boots/shoes at work because they are rubbing his toe.   Wanted to touch base with Dr. Paulla Dolly and make sure he is okay with writing the patient out of work until 11/25/18.

## 2018-11-22 ENCOUNTER — Encounter: Payer: Self-pay | Admitting: Podiatry

## 2018-11-29 ENCOUNTER — Encounter (HOSPITAL_COMMUNITY): Payer: Self-pay | Admitting: Emergency Medicine

## 2018-11-29 ENCOUNTER — Ambulatory Visit (INDEPENDENT_AMBULATORY_CARE_PROVIDER_SITE_OTHER): Payer: BC Managed Care – PPO

## 2018-11-29 ENCOUNTER — Ambulatory Visit (HOSPITAL_COMMUNITY)
Admission: EM | Admit: 2018-11-29 | Discharge: 2018-11-29 | Disposition: A | Discharge status: Home / Self Care | Payer: BC Managed Care – PPO | Attending: Family Medicine | Admitting: Family Medicine

## 2018-11-29 DIAGNOSIS — M79641 Pain in right hand: Secondary | ICD-10-CM

## 2018-11-29 DIAGNOSIS — L03113 Cellulitis of right upper limb: Secondary | ICD-10-CM

## 2018-11-29 DIAGNOSIS — M7989 Other specified soft tissue disorders: Secondary | ICD-10-CM | POA: Diagnosis not present

## 2018-11-29 MED ORDER — DOXYCYCLINE HYCLATE 100 MG PO CAPS: 100.0000 mg | ORAL_CAPSULE | Freq: Two times a day (BID) | ORAL | 0 refills | Status: DC

## 2018-11-29 NOTE — ED Triage Notes (Signed)
Pt stsates last Wednesday he noticed a blister on the palm of his R hand, states last night he noticed his R hand was swollen.

## 2018-11-29 NOTE — ED Provider Notes (Signed)
MRN: [id number] DOB: 06/24/1965  Subjective:   Theodore Rodriguez is a 53 y.o. male presenting for 2-day history of acute onset worsening moderate to severe right hand swelling and pain from a blister that popped open yesterday.  Patient was working in the yard a couple of days ago when his blister formed.  He has quickly developed pain and redness, has inability to fully close his hand.  Has a history of diabetes and high blood pressure.  Is on oral medications for this.  No current facility-administered medications for this encounter.   Current Outpatient Medications:  .  metFORMIN (GLUCOPHAGE) 500 MG tablet, Take 1 tablet (500 mg total) by mouth 2 (two) times daily with a meal., Disp: 180 tablet, Rfl: 1 .  neomycin-polymyxin-hydrocortisone (CORTISPORIN) OTIC solution, Apply 1-2 drops to toe after soaking twice a day, Disp: 10 mL, Rfl: 0 .  valsartan-hydrochlorothiazide (DIOVAN-HCT) 160-25 MG tablet, Take 0.5 tablets by mouth daily., Disp: 45 tablet, Rfl: 1   No Known Allergies  Past Medical History:  Diagnosis Date  . Diabetes mellitus without complication ([place])   . Hypertension      History reviewed. No pertinent surgical history.  ROS Denies fever, nausea, vomiting, abdominal pain, red streaking, forearm pain, loss of sensation, discoloration, frank drainage of pus or bleeding.  Objective:   Vitals: BP 121/82   Pulse (!) 109   Temp 98.6 F (37 C)   Resp 18   Physical Exam Constitutional:      Appearance: Normal appearance. He is well-developed and normal weight.  HENT:     Head: Normocephalic and atraumatic.     Right Ear: External ear normal.     Left Ear: External ear normal.     Nose: Nose normal.     Mouth/Throat:     Pharynx: Oropharynx is clear.  Eyes:     Extraocular Movements: Extraocular movements intact.     Pupils: Pupils are equal, round, and reactive to light.  Cardiovascular:     Rate and Rhythm: Normal rate.  Pulmonary:     Effort: Pulmonary effort is  normal.  Musculoskeletal:     Right hand: He exhibits decreased range of motion, tenderness, bony tenderness (Between third MCP up to PIP) and swelling. He exhibits normal capillary refill and no laceration. Normal strength noted.       Hands:  Neurological:     Mental Status: He is alert and oriented to person, place, and time.  Psychiatric:        Mood and Affect: Mood normal.        Behavior: Behavior normal.     Dg Hand Complete Right  Result Date: 11/29/2018 CLINICAL DATA:  Pain and swelling at the bases of the fourth and fifth digits. No trauma. EXAM: RIGHT HAND - COMPLETE 3+ VIEW COMPARISON:  None. FINDINGS: Evidence of remote trauma at the fifth PIP joint. No acute fracture or worrisome bone lesion. Diffuse soft tissue swelling along the ulnar aspect of the hand. No gas is seen in the soft tissues and no radiopaque foreign body is identified. IMPRESSION: No acute bony findings or radiopaque foreign body. Remote trauma involving the fifth finger. Electronically Signed   By: Marijo Sanes M.D.   On: 11/29/2018 12:43    Assessment and Plan :   1. Cellulitis of right upper extremity   2. Right hand pain    Start doxycycline to cover for cellulitis of his right hand.  X-ray is reassuring.  Use Tylenol and ibuprofen for pain  and inflammation. Counseled patient on potential for adverse effects with medications prescribed/recommended today, strict ER and return-to-clinic precautions discussed, patient verbalized understanding.    Jaynee Eagles, PA-C 11/29/18 1323

## 2018-12-06 ENCOUNTER — Other Ambulatory Visit: Payer: Self-pay | Admitting: Podiatry

## 2018-12-06 ENCOUNTER — Other Ambulatory Visit: Payer: Self-pay

## 2018-12-06 ENCOUNTER — Ambulatory Visit (INDEPENDENT_AMBULATORY_CARE_PROVIDER_SITE_OTHER): Payer: BC Managed Care – PPO | Admitting: Podiatry

## 2018-12-06 VITALS — Temp 96.8°F

## 2018-12-06 DIAGNOSIS — L600 Ingrowing nail: Secondary | ICD-10-CM

## 2018-12-06 MED ORDER — HYDROCODONE-ACETAMINOPHEN 10-325 MG PO TABS: 1.0000 | ORAL_TABLET | Freq: Three times a day (TID) | ORAL | 0 refills | Status: AC | PRN

## 2018-12-06 NOTE — Telephone Encounter (Signed)
Dr. Price please advice 

## 2018-12-06 NOTE — Patient Instructions (Signed)

## 2018-12-10 NOTE — Progress Notes (Signed)
Subjective:   Patient ID: Theodore Rodriguez, male   DOB: 53 y.o.   MRN: [id number]   HPI Patient presents stating the right are healing real well and he wants to go ahead and have the left nails removed at this time.  States they have been sort thick and he cannot wear shoe gear comfortably   ROS      Objective:  Physical Exam  Neurovascular status intact with well-healed surgical sites right first second and third toes with thick yellow brittle nailbeds first second third left     Assessment:  Doing well from recovery of the right nail surgery with thickened yellow brittle nails 1-3 left     Plan:  H&P conditions reviewed and discussed correction of the left nail patient wants procedures.  Explained risk and he signed consent form and today I infiltrated with 180 mg Xylocaine Marcaine mixture and sterile prep applied to the left hallux second third toe and using sterile instrumentation I remove the nails exposed the matrix and applied phenol for applications 30 seconds followed by alcohol lavage sterile dressing.  Gave instructions on soaks and reappoint new

## 2018-12-11 ENCOUNTER — Encounter: Payer: Self-pay | Admitting: *Deleted

## 2018-12-11 ENCOUNTER — Telehealth: Payer: Self-pay | Admitting: Podiatry

## 2018-12-11 NOTE — Telephone Encounter (Signed)
Pt was seen in office on 11/13/18 and had a nail removed. Pt was written out of work until 11/25/18 and called this morning requesting an updated letter to extend his out of work note. Pt states he is having trouble wearing his boots/shoes at work because they are rubbing his toe.   Wanted to touch base with Dr. Paulla Dolly and make sure he is okay with extending the patients out of work note.

## 2018-12-11 NOTE — Telephone Encounter (Signed)
Pt states he had the left foot done on Friday the 10th and will need to be out of work until 12/30/2018, because he wears steel toed boots and works 12 hour days. Dr. Josephina Shih the out of work note as pt requested. Pt states he will pick up the note today.

## 2018-12-11 NOTE — Telephone Encounter (Signed)
error 

## 2018-12-30 ENCOUNTER — Other Ambulatory Visit: Payer: Self-pay

## 2018-12-30 ENCOUNTER — Encounter: Payer: Self-pay | Admitting: Podiatry

## 2018-12-30 ENCOUNTER — Ambulatory Visit (INDEPENDENT_AMBULATORY_CARE_PROVIDER_SITE_OTHER): Payer: BC Managed Care – PPO | Admitting: Podiatry

## 2018-12-30 DIAGNOSIS — L03032 Cellulitis of left toe: Secondary | ICD-10-CM | POA: Diagnosis not present

## 2018-12-30 MED ORDER — DOXYCYCLINE HYCLATE 100 MG PO TABS: 100.0000 mg | ORAL_TABLET | Freq: Two times a day (BID) | ORAL | 0 refills | Status: DC

## 2019-01-01 NOTE — Progress Notes (Signed)
Subjective:   Patient ID: Theodore Rodriguez, male   DOB: 53 y.o.   MRN: [id number]   HPI Patient states doing well but I got this area of redness on my big toe left that I wanted checked and it is been slightly sore   ROS      Objective:  Physical Exam  Neurovascular status intact with slight redness of the left hallux lateral side localized with no proximal edema erythema or drainage noted currently     Assessment:  May have a low-grade paronychia of the left hallux but no indications of proximal spread currently     Plan:  H&P education rendered to patient and today I went ahead as precautionary measure I placed him on doxycycline 100 mg twice daily for 10 days gave instructions on continued soaks and utilization of medication and reappoint to recheck

## 2019-01-08 ENCOUNTER — Other Ambulatory Visit: Payer: Self-pay

## 2019-01-08 ENCOUNTER — Telehealth: Payer: Self-pay | Admitting: Family Medicine

## 2019-01-08 DIAGNOSIS — I10 Essential (primary) hypertension: Secondary | ICD-10-CM

## 2019-01-08 MED ORDER — VALSARTAN-HYDROCHLOROTHIAZIDE 160-25 MG PO TABS: 0.5000 | ORAL_TABLET | Freq: Every day | ORAL | 1 refills | Status: DC

## 2019-01-08 NOTE — Telephone Encounter (Signed)
FYI: Pt has been taking 1 tab of the bp meds instead of the 1.5. Sent refill on med of the 45 tabs rx'd instead of 30.

## 2019-01-08 NOTE — Telephone Encounter (Signed)
Medication: valsartan-hydrochlorothiazide (DIOVAN-HCT) 160-25 MG tablet     Patient is requesting a refill of this medication. He states he has been taking 1 tablet daily rather than the 1/2  Pharmacy:  [facility] [place], [place] - [address] AT [address] [phone] (Phone) [phone]

## 2019-01-09 ENCOUNTER — Telehealth: Payer: Self-pay | Admitting: *Deleted

## 2019-01-09 MED ORDER — VALSARTAN-HYDROCHLOROTHIAZIDE 160-25 MG PO TABS: 1.0000 | ORAL_TABLET | Freq: Every day | ORAL | 1 refills | Status: DC

## 2019-01-09 NOTE — Telephone Encounter (Signed)
Please notify patient that the dose was corrected to reflect the one tablet instead of half tablet.

## 2019-01-09 NOTE — Telephone Encounter (Signed)
Pt notified that his medication Valsartan-hydrochlorothiazide 160-25 is at his pharmacy with the corrected directions to take one tab daily. Pt voiced understanding.

## 2019-01-15 ENCOUNTER — Encounter: Payer: Self-pay | Admitting: Podiatry

## 2019-01-21 ENCOUNTER — Ambulatory Visit (INDEPENDENT_AMBULATORY_CARE_PROVIDER_SITE_OTHER): Payer: BC Managed Care – PPO | Admitting: Family Medicine

## 2019-01-21 ENCOUNTER — Other Ambulatory Visit: Payer: Self-pay

## 2019-01-21 ENCOUNTER — Encounter: Payer: Self-pay | Admitting: Family Medicine

## 2019-01-21 VITALS — BP 105/67 | HR 102 | Temp 99.6°F | Resp 14 | Wt 151.0 lb

## 2019-01-21 DIAGNOSIS — L02416 Cutaneous abscess of left lower limb: Secondary | ICD-10-CM | POA: Diagnosis not present

## 2019-01-21 MED ORDER — DOXYCYCLINE HYCLATE 100 MG PO CAPS: 100.0000 mg | ORAL_CAPSULE | Freq: Two times a day (BID) | ORAL | 0 refills | Status: DC

## 2019-01-21 NOTE — Progress Notes (Signed)
Patient ID: Theodore Rodriguez, male    DOB: [birth year]  Age: 53 y.o. MRN: [id number]  Chief Complaint  Patient presents with  . Recurrent Skin Infections     Started off as a pimple on left knee. After i popped it it became swollen and painful.    Subjective:   53 year old man who comes in with a history of 5 days ago developing a little pimple on his left knee after he had been moving.  He popped it and express pus out of it.  It is continued to drains some off and on over the last 5 days.  No other lesions anywhere on him actively at this time.  He is labeled as prediabetic  He had a cellulitis of his right hand earlier this summer which was successfully treated with doxycycline.  Current allergies, medications, problem list, past/family and social histories reviewed.  Objective:  BP 105/67 (BP Location: Right Arm, Patient Position: Sitting, Cuff Size: Normal)   Pulse (!) 102   Temp 99.6 F (37.6 C) (Oral)   Resp 14   Wt 151 lb (68.5 kg)   SpO2 98%   BMI 25.13 kg/m   No major distress.  No left inguinal nodes noted.  He has a soft, mildly inflamed 2 cm blistered area on his left knee with a scab centrally.  Nothing is draining at this time.  There is mild induration and tenderness surrounding that area.  Procedure note: The area was anesthetized with ethyl chloride cooling.  A scalpel was used to lift up the blister.  There is actually little or no pus underneath there but a moist surface which was cultured.  A piece of the skin was debrided so that the lesion can be drained and cleaned well.  Patient tolerated well.  Assessment & Plan:   Assessment: 1. Abscess of knee, left       Plan: See instructions.  I do not think he needs to come back again for follow-up unless it gets worse.  No orders of the defined types were placed in this encounter.   Meds ordered this encounter  Medications  . doxycycline (VIBRAMYCIN) 100 MG capsule    Sig: Take 1 capsule (100 mg total) by  mouth 2 (two) times daily.    Dispense:  20 capsule    Refill:  0         Patient Instructions    Wash your knee twice daily and put a Band-Aid across it.  Take doxycycline 1 twice daily  Stay off work through tomorrow  Return if worse.  If you have lab work done today you will be contacted with your lab results within the next 2 weeks.  If you have not heard from [place] then please contact us. The fastest way to get your results is to register for My Chart.   IF you received an x-ray today, you will receive an invoice from [facility]. Please contact [facility] at [phone] with questions or concerns regarding your invoice.   IF you received labwork today, you will receive an invoice from [facility]. Please contact [facility] at [phone] with questions or concerns regarding your invoice.   Our billing staff will not be able to assist you with questions regarding bills from these companies.  You will be contacted with the lab results as soon as they are available. The fastest way to get your results is to activate your My Chart account. Instructions are located on the last page of this paperwork.  If you have not heard from [place] regarding the results in 2 weeks, please contact this office.         Return if symptoms worsen or fail to improve.   Ruben Reason, MD 01/21/2019

## 2019-01-21 NOTE — Patient Instructions (Addendum)
  Wash your knee twice daily and put a Band-Aid across it.  Take doxycycline 1 twice daily  Stay off work through tomorrow  Return if worse.  If you have lab work done today you will be contacted with your lab results within the next 2 weeks.  If you have not heard from [place] then please contact us. The fastest way to get your results is to register for My Chart.   IF you received an x-ray today, you will receive an invoice from [facility]. Please contact [facility] at [phone] with questions or concerns regarding your invoice.   IF you received labwork today, you will receive an invoice from [facility]. Please contact [facility] at [phone] with questions or concerns regarding your invoice.   Our billing staff will not be able to assist you with questions regarding bills from these companies.  You will be contacted with the lab results as soon as they are available. The fastest way to get your results is to activate your My Chart account. Instructions are located on the last page of this paperwork. If you have not heard from [place] regarding the results in 2 weeks, please contact this office.

## 2019-01-23 LAB — WOUND CULTURE

## 2019-01-27 ENCOUNTER — Telehealth: Payer: Self-pay | Admitting: Family Medicine

## 2019-01-27 ENCOUNTER — Encounter: Payer: Self-pay | Admitting: Radiology

## 2019-01-27 NOTE — Telephone Encounter (Signed)
Pt called in asking for lab results

## 2019-01-28 NOTE — Telephone Encounter (Signed)
Pt states if you can call before 2 pm, he goes to work then.

## 2019-01-29 NOTE — Telephone Encounter (Signed)
Informed pt of labs.  

## 2019-03-25 DIAGNOSIS — Z03818 Encounter for observation for suspected exposure to other biological agents ruled out: Secondary | ICD-10-CM | POA: Diagnosis not present

## 2019-03-25 DIAGNOSIS — Z20828 Contact with and (suspected) exposure to other viral communicable diseases: Secondary | ICD-10-CM | POA: Diagnosis not present

## 2019-04-02 DIAGNOSIS — Z20828 Contact with and (suspected) exposure to other viral communicable diseases: Secondary | ICD-10-CM | POA: Diagnosis not present

## 2019-07-10 ENCOUNTER — Other Ambulatory Visit: Payer: Self-pay

## 2019-07-10 DIAGNOSIS — I10 Essential (primary) hypertension: Secondary | ICD-10-CM

## 2019-07-10 MED ORDER — VALSARTAN-HYDROCHLOROTHIAZIDE 160-25 MG PO TABS: 1.0000 | ORAL_TABLET | Freq: Every day | ORAL | 0 refills | Status: DC

## 2019-08-13 ENCOUNTER — Ambulatory Visit (INDEPENDENT_AMBULATORY_CARE_PROVIDER_SITE_OTHER): Payer: BC Managed Care – PPO | Admitting: Registered Nurse

## 2019-08-13 ENCOUNTER — Encounter: Payer: Self-pay | Admitting: Registered Nurse

## 2019-08-13 ENCOUNTER — Other Ambulatory Visit: Payer: Self-pay

## 2019-08-13 VITALS — BP 122/84 | HR 84 | Temp 97.3°F | Ht 67.0 in | Wt 153.2 lb

## 2019-08-13 DIAGNOSIS — S76211A Strain of adductor muscle, fascia and tendon of right thigh, initial encounter: Secondary | ICD-10-CM | POA: Diagnosis not present

## 2019-08-13 MED ORDER — MELOXICAM 7.5 MG PO TABS: 7.5000 mg | ORAL_TABLET | Freq: Every day | ORAL | 0 refills | Status: DC

## 2019-08-13 MED ORDER — METHOCARBAMOL 500 MG PO TABS: 500.0000 mg | ORAL_TABLET | Freq: Four times a day (QID) | ORAL | 0 refills | Status: DC

## 2019-08-13 NOTE — Patient Instructions (Signed)
° ° ° °  If you have lab work done today you will be contacted with your lab results within the next 2 weeks.  If you have not heard from us then please contact us. The fastest way to get your results is to register for My Chart. ° ° °IF you received an x-ray today, you will receive an invoice from [facility]. Please contact [facility] at [phone] with questions or concerns regarding your invoice.  ° °IF you received labwork today, you will receive an invoice from [facility]. Please contact [facility] at [phone] with questions or concerns regarding your invoice.  ° °Our billing staff will not be able to assist you with questions regarding bills from these companies. ° °You will be contacted with the lab results as soon as they are available. The fastest way to get your results is to activate your My Chart account. Instructions are located on the last page of this paperwork. If you have not heard from us regarding the results in 2 weeks, please contact this office. °  ° ° ° °

## 2019-08-21 NOTE — Progress Notes (Signed)
Acute Office Visit  Subjective:    Patient ID: Theodore Rodriguez, male    DOB: 28-Feb-1966, 54 y.o.   MRN: [id number]  Chief Complaint  Patient presents with  . Muscle Pain    patient states yesterday he pulled a muscle in the groin area . per patient he didnt  take anything for the pain and is feeling better , but just wanted to make sure    HPI Patient is in today for groin injury Yesterday was doing som exercising and states he felt acute pain in the R groin Stopped, went home and rested Has not taken anything for pain Feeling improved today, but still able to reproduce pain with certain ROM activities.  He wants to make sure there is nothing more severe happening  Denies radiculopathy, back pain, loss of bowel or bladder control, numbness, tingling, bruising in the area, swelling of the testicles, urine or stool obstruction.  Past Medical History:  Diagnosis Date  . Diabetes mellitus without complication ([place])   . Hypertension     No past surgical history on file.  Family History  Problem Relation Age of Onset  . Hypertension Mother   . Colon cancer Maternal Aunt   . Colon cancer Maternal Uncle   . Esophageal cancer Neg Hx   . Stomach cancer Neg Hx   . Rectal cancer Neg Hx     Social History   Socioeconomic History  . Marital status: Married    Spouse name: Not on file  . Number of children: Not on file  . Years of education: Not on file  . Highest education level: Not on file  Occupational History  . Not on file  Tobacco Use  . Smoking status: Never Smoker  . Smokeless tobacco: Never Used  Substance and Sexual Activity  . Alcohol use: Yes    Comment: occasionally  . Drug use: No  . Sexual activity: Not on file  Other Topics Concern  . Not on file  Social History Narrative  . Not on file   Social Determinants of Health   Financial Resource Strain:   . Difficulty of Paying Living Expenses:   Food Insecurity:   . Worried About Charity fundraiser in  the Last Year:   . Arboriculturist in the Last Year:   Transportation Needs:   . Film/video editor (Medical):   Marland Kitchen Lack of Transportation (Non-Medical):   Physical Activity:   . Days of Exercise per Week:   . Minutes of Exercise per Session:   Stress:   . Feeling of Stress :   Social Connections:   . Frequency of Communication with Friends and Family:   . Frequency of Social Gatherings with Friends and Family:   . Attends Religious Services:   . Active Member of Clubs or Organizations:   . Attends Archivist Meetings:   Marland Kitchen Marital Status:   Intimate Partner Violence:   . Fear of Current or Ex-Partner:   . Emotionally Abused:   Marland Kitchen Physically Abused:   . Sexually Abused:     Outpatient Medications Prior to Visit  Medication Sig Dispense Refill  . valsartan-hydrochlorothiazide (DIOVAN-HCT) 160-25 MG tablet Take 1 tablet by mouth daily. 90 tablet 0  . doxycycline (VIBRAMYCIN) 100 MG capsule Take 1 capsule (100 mg total) by mouth 2 (two) times daily. (Patient not taking: Reported on 08/13/2019) 20 capsule 0  . metFORMIN (GLUCOPHAGE) 500 MG tablet Take 1 tablet (500 mg total) by mouth  2 (two) times daily with a meal. (Patient not taking: Reported on 08/13/2019) 180 tablet 1   No facility-administered medications prior to visit.    No Known Allergies  Review of Systems Per hpi      Objective:    Physical Exam Musculoskeletal:        General: Signs of injury present. No swelling, tenderness or deformity. Normal range of motion.     Right lower leg: No edema.     Left lower leg: No edema.     Comments: Some pain with ROM but able to reach full ROM Neg straight leg raise Knee testing negative No pain in lower back     BP 122/84   Pulse 84   Temp (!) 97.3 F (36.3 C) (Temporal)   Ht 5\' 7"  (1.702 m)   Wt 153 lb 3.2 oz (69.5 kg)   SpO2 99%   BMI 23.99 kg/m  Wt Readings from Last 3 Encounters:  08/13/19 153 lb 3.2 oz (69.5 kg)  01/21/19 151 lb (68.5 kg)   11/04/18 148 lb 6.4 oz (67.3 kg)    There are no preventive care reminders to display for this patient.  There are no preventive care reminders to display for this patient.   Lab Results  Component Value Date   TSH 0.708 04/25/2017   Lab Results  Component Value Date   WBC 4.9 05/20/2017   HGB 17.0 05/20/2017   HCT 50.0 05/20/2017   MCV 84.5 05/20/2017   PLT 202 05/20/2017   Lab Results  Component Value Date   NA 134 11/04/2018   K 4.3 11/04/2018   CO2 18 (L) 11/04/2018   GLUCOSE 123 (H) 11/04/2018   BUN 25 (H) 11/04/2018   CREATININE 1.16 11/04/2018   BILITOT 0.3 11/04/2018   ALKPHOS 75 11/04/2018   AST 19 11/04/2018   ALT 24 11/04/2018   PROT 6.8 11/04/2018   ALBUMIN 4.3 11/04/2018   CALCIUM 9.0 11/04/2018   ANIONGAP 9 05/20/2017   Lab Results  Component Value Date   CHOL 169 11/04/2018   Lab Results  Component Value Date   HDL 69 11/04/2018   Lab Results  Component Value Date   LDLCALC 80 11/04/2018   Lab Results  Component Value Date   TRIG 99 11/04/2018   Lab Results  Component Value Date   CHOLHDL 2.4 11/04/2018   Lab Results  Component Value Date   HGBA1C 6.2 (A) 11/04/2018       Assessment & Plan:   Problem List Items Addressed This Visit    None    Visit Diagnoses    Strain of right groin    -  Primary   Relevant Medications   meloxicam (MOBIC) 7.5 MG tablet   methocarbamol (ROBAXIN) 500 MG tablet       Meds ordered this encounter  Medications  . meloxicam (MOBIC) 7.5 MG tablet    Sig: Take 1 tablet (7.5 mg total) by mouth daily.    Dispense:  30 tablet    Refill:  0    Order Specific Question:   Supervising Provider    Answer:   Delia Chimes A [id number]  . methocarbamol (ROBAXIN) 500 MG tablet    Sig: Take 1 tablet (500 mg total) by mouth 4 (four) times daily.    Dispense:  40 tablet    Refill:  0    Order Specific Question:   Supervising Provider    Answer:   Delia Chimes A [id number]  PLAN  Exam reassuring   Likely just muscle strain of groin  RICE, stretching as tolerated, meloxicam and robaxin  rtc precautions given  Patient encouraged to call clinic with any questions, comments, or concerns.   Maximiano Coss, NP

## 2019-10-09 ENCOUNTER — Other Ambulatory Visit: Payer: Self-pay

## 2019-10-09 ENCOUNTER — Other Ambulatory Visit: Payer: Self-pay | Admitting: Family Medicine

## 2019-10-09 DIAGNOSIS — I10 Essential (primary) hypertension: Secondary | ICD-10-CM

## 2019-10-09 NOTE — Telephone Encounter (Signed)
Copied from [place] [phone]. Topic: Quick Communication - Rx Refill/Question >> Oct 09, 2019  9:54 AM Leward Quan A wrote: Medication: valsartan-hydrochlorothiazide (DIOVAN-HCT) 160-25 MG tablet   Has the patient contacted their pharmacy? Yes.   (Agent: If no, request that the patient contact the pharmacy for the refill.) (Agent: If yes, when and what did the pharmacy advise?)  Preferred Pharmacy (with phone number or street name): [facility] [phone] - [place], [address] AT [address]  Phone:  [phone] Fax:  [phone]     Agent: Please be advised that RX refills may take up to 3 business days. We ask that you follow-up with your pharmacy.

## 2019-10-09 NOTE — Telephone Encounter (Signed)
Requested medication (s) are due for refill today:  Yes  Requested medication (s) are on the active medication list:  Yes  Future visit scheduled:  No  Last Refill: 07/10/19; #90; no refills  Note to clinic: pt. Overdue for OV; called pt.; left vm to call office to schedule an appt.   Requested Prescriptions  Pending Prescriptions Disp Refills   valsartan-hydrochlorothiazide (DIOVAN-HCT) 160-25 MG tablet 90 tablet 0    Sig: Take 1 tablet by mouth daily.      Cardiovascular: ARB + Diuretic Combos Failed - 10/09/2019 10:04 AM      Failed - K in normal range and within 180 days    Potassium  Date Value Ref Range Status  11/04/2018 4.3 3.5 - 5.2 mmol/L Final          Failed - Na in normal range and within 180 days    Sodium  Date Value Ref Range Status  11/04/2018 134 134 - 144 mmol/L Final          Failed - Cr in normal range and within 180 days    Creatinine, Ser  Date Value Ref Range Status  11/04/2018 1.16 0.76 - 1.27 mg/dL Final          Failed - Ca in normal range and within 180 days    Calcium  Date Value Ref Range Status  11/04/2018 9.0 8.7 - 10.2 mg/dL Final   Calcium, Ion  Date Value Ref Range Status  05/20/2017 1.12 (L) 1.15 - 1.40 mmol/L Final          Passed - Patient is not pregnant      Passed - Last BP in normal range    BP Readings from Last 1 Encounters:  08/13/19 122/84          Passed - Valid encounter within last 6 months    Recent Outpatient Visits           1 month ago Strain of right groin   [facility], [place], NP   8 months ago Abscess of knee, left   [facility] at [facility], Fenton Malling, MD   11 months ago Encounter for health maintenance examination in adult   Primary Care at [facility], Arlie Solomons, MD   1 year ago Acute non-recurrent maxillary sinusitis   Primary Care at [facility], Arlie Solomons, MD   1 year ago Essential hypertension   Primary Care at [facility], Arlie Solomons, MD

## 2019-10-10 ENCOUNTER — Other Ambulatory Visit: Payer: Self-pay

## 2019-10-10 ENCOUNTER — Ambulatory Visit (INDEPENDENT_AMBULATORY_CARE_PROVIDER_SITE_OTHER): Payer: BC Managed Care – PPO

## 2019-10-10 ENCOUNTER — Ambulatory Visit (INDEPENDENT_AMBULATORY_CARE_PROVIDER_SITE_OTHER): Payer: BC Managed Care – PPO | Admitting: Registered Nurse

## 2019-10-10 ENCOUNTER — Encounter: Payer: Self-pay | Admitting: Registered Nurse

## 2019-10-10 VITALS — BP 130/83 | HR 70 | Temp 97.3°F | Resp 17 | Ht 67.0 in | Wt 153.0 lb

## 2019-10-10 DIAGNOSIS — Z13220 Encounter for screening for lipoid disorders: Secondary | ICD-10-CM | POA: Diagnosis not present

## 2019-10-10 DIAGNOSIS — Z13228 Encounter for screening for other metabolic disorders: Secondary | ICD-10-CM

## 2019-10-10 DIAGNOSIS — Z1329 Encounter for screening for other suspected endocrine disorder: Secondary | ICD-10-CM

## 2019-10-10 DIAGNOSIS — M25561 Pain in right knee: Secondary | ICD-10-CM

## 2019-10-10 DIAGNOSIS — G8929 Other chronic pain: Secondary | ICD-10-CM

## 2019-10-10 DIAGNOSIS — I10 Essential (primary) hypertension: Secondary | ICD-10-CM | POA: Diagnosis not present

## 2019-10-10 DIAGNOSIS — Z130 Encounter for screening for diseases of the blood and blood-forming organs and certain disorders involving the immune mechanism: Secondary | ICD-10-CM

## 2019-10-10 LAB — POCT GLYCOSYLATED HEMOGLOBIN (HGB A1C): Hemoglobin A1C: 6 % — AB (ref 4.0–5.6)

## 2019-10-10 NOTE — Patient Instructions (Signed)
° ° ° °  If you have lab work done today you will be contacted with your lab results within the next 2 weeks.  If you have not heard from us then please contact us. The fastest way to get your results is to register for My Chart. ° ° °IF you received an x-ray today, you will receive an invoice from [facility]. Please contact [facility] at [phone] with questions or concerns regarding your invoice.  ° °IF you received labwork today, you will receive an invoice from [facility]. Please contact [facility] at [phone] with questions or concerns regarding your invoice.  ° °Our billing staff will not be able to assist you with questions regarding bills from these companies. ° °You will be contacted with the lab results as soon as they are available. The fastest way to get your results is to activate your My Chart account. Instructions are located on the last page of this paperwork. If you have not heard from us regarding the results in 2 weeks, please contact this office. °  ° ° ° °

## 2019-10-10 NOTE — Progress Notes (Signed)
Hello-   If we could call Mr sarwar - xray shows no concern. Take tylenol, rest/ice/compression/elevation. We will see what labs show and go from there  Thank you  Kathrin Ruddy, NP

## 2019-10-10 NOTE — Progress Notes (Signed)
Established Patient Office Visit  Subjective:  Patient ID: Theodore Rodriguez, male    DOB: 1965/08/13  Age: 54 y.o. MRN: [id number]  CC:  Chief Complaint  Patient presents with  . Knee Pain    Patient states he has been noticing swelling in his right knee for about three weeks and its painful when he walk for a long period of time.  . Medication Refill    pended    HPI [name] presents for HTN med refill and R knee pain and swelling  Taking Diovan-HCT 160-25mg  PO qd with good effect. Denies chest pain, shob, doe, dependent edema, claudication, headache, visual changes   Also notes R knee pain. Ongoing for some time, becoming more consistent. Swelling, pain worse when standing long periods, walking significant distances. 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 No hx of injury to this knee. No involvement of ankle or hip. No swelling above or below knee.   Past Medical History:  Diagnosis Date  . Diabetes mellitus without complication ([place])   . Hypertension     No past surgical history on file.  Family History  Problem Relation Age of Onset  . Hypertension Mother   . Colon cancer Maternal Aunt   . Colon cancer Maternal Uncle   . Esophageal cancer Neg Hx   . Stomach cancer Neg Hx   . Rectal cancer Neg Hx     Social History   Socioeconomic History  . Marital status: Married    Spouse name: Not on file  . Number of children: Not on file  . Years of education: Not on file  . Highest education level: Not on file  Occupational History  . Not on file  Tobacco Use  . Smoking status: Never Smoker  . Smokeless tobacco: Never Used  Substance and Sexual Activity  . Alcohol use: Yes    Comment: occasionally  . Drug use: No  . Sexual activity: Not on file  Other Topics Concern  . Not on file  Social History Narrative  . Not on file   Social Determinants of Health   Financial Resource Strain:   . Difficulty of Paying Living  Expenses:   Food Insecurity:   . Worried About Charity fundraiser in the Last Year:   . Arboriculturist in the Last Year:   Transportation Needs:   . Film/video editor (Medical):   Marland Kitchen Lack of Transportation (Non-Medical):   Physical Activity:   . Days of Exercise per Week:   . Minutes of Exercise per Session:   Stress:   . Feeling of Stress :   Social Connections:   . Frequency of Communication with Friends and Family:   . Frequency of Social Gatherings with Friends and Family:   . Attends Religious Services:   . Active Member of Clubs or Organizations:   . Attends Archivist Meetings:   Marland Kitchen Marital Status:   Intimate Partner Violence:   . Fear of Current or Ex-Partner:   . Emotionally Abused:   Marland Kitchen Physically Abused:   . Sexually Abused:     Outpatient Medications Prior to Visit  Medication Sig Dispense Refill  . valsartan-hydrochlorothiazide (DIOVAN-HCT) 160-25 MG tablet Take 1 tablet by mouth daily. 90 tablet 0  . doxycycline (VIBRAMYCIN) 100 MG capsule Take 1 capsule (100 mg total) by mouth 2 (two) times daily. (Patient not taking: Reported on 08/13/2019) 20 capsule 0  . meloxicam (MOBIC)  7.5 MG tablet Take 1 tablet (7.5 mg total) by mouth daily. (Patient not taking: Reported on 10/10/2019) 30 tablet 0  . metFORMIN (GLUCOPHAGE) 500 MG tablet Take 1 tablet (500 mg total) by mouth 2 (two) times daily with a meal. (Patient not taking: Reported on 08/13/2019) 180 tablet 1  . methocarbamol (ROBAXIN) 500 MG tablet Take 1 tablet (500 mg total) by mouth 4 (four) times daily. (Patient not taking: Reported on 10/10/2019) 40 tablet 0   No facility-administered medications prior to visit.    No Known Allergies  ROS Review of Systems  Constitutional: Negative.   HENT: Negative.   Eyes: Negative.   Respiratory: Negative.   Cardiovascular: Negative.   Gastrointestinal: Negative.   Endocrine: Negative.   Genitourinary: Negative.   Musculoskeletal: Positive for arthralgias  and joint swelling. Negative for back pain, gait problem, myalgias, neck pain and neck stiffness.  Skin: Negative.   Allergic/Immunologic: Negative.   Neurological: Negative.   Hematological: Negative.   Psychiatric/Behavioral: Negative.   All other systems reviewed and are negative.     Objective:    Physical Exam  Constitutional: He is oriented to person, place, and time. He appears well-developed and well-nourished. No distress.  Cardiovascular: Normal rate and regular rhythm. Exam reveals no gallop and no friction rub.  No murmur heard. Pulmonary/Chest: Effort normal and breath sounds normal. No respiratory distress. He has no wheezes. He has no rales. He exhibits no tenderness.  Musculoskeletal:        General: Tenderness and edema present. No deformity. Normal range of motion.     Comments: r knee  Neurological: He is alert and oriented to person, place, and time.  Skin: Skin is warm and dry. No rash noted. He is not diaphoretic. No erythema. No pallor.  Psychiatric: He has a normal mood and affect. His behavior is normal. Judgment and thought content normal.  Nursing note and vitals reviewed.   BP 130/83   Pulse 70   Temp (!) 97.3 F (36.3 C) (Temporal)   Resp 17   Ht 5\' 7"  (1.702 m)   Wt 153 lb (69.4 kg)   SpO2 97%   BMI 23.96 kg/m  Wt Readings from Last 3 Encounters:  10/10/19 153 lb (69.4 kg)  08/13/19 153 lb 3.2 oz (69.5 kg)  01/21/19 151 lb (68.5 kg)     There are no preventive care reminders to display for this patient.  There are no preventive care reminders to display for this patient.  Lab Results  Component Value Date   TSH 0.708 04/25/2017   Lab Results  Component Value Date   WBC 4.9 05/20/2017   HGB 17.0 05/20/2017   HCT 50.0 05/20/2017   MCV 84.5 05/20/2017   PLT 202 05/20/2017   Lab Results  Component Value Date   NA 134 11/04/2018   K 4.3 11/04/2018   CO2 18 (L) 11/04/2018   GLUCOSE 123 (H) 11/04/2018   BUN 25 (H) 11/04/2018    CREATININE 1.16 11/04/2018   BILITOT 0.3 11/04/2018   ALKPHOS 75 11/04/2018   AST 19 11/04/2018   ALT 24 11/04/2018   PROT 6.8 11/04/2018   ALBUMIN 4.3 11/04/2018   CALCIUM 9.0 11/04/2018   ANIONGAP 9 05/20/2017   Lab Results  Component Value Date   CHOL 169 11/04/2018   Lab Results  Component Value Date   HDL 69 11/04/2018   Lab Results  Component Value Date   LDLCALC 80 11/04/2018   Lab Results  Component Value  Date   TRIG 99 11/04/2018   Lab Results  Component Value Date   CHOLHDL 2.4 11/04/2018   Lab Results  Component Value Date   HGBA1C 6.0 (A) 10/10/2019      Assessment & Plan:   Problem List Items Addressed This Visit      Cardiovascular and Mediastinum   Essential hypertension - Primary   Relevant Orders   TSH   POCT glycosylated hemoglobin (Hb A1C) (Completed)   Lipid panel   CBC with Differential   Comprehensive metabolic panel    Other Visit Diagnoses    Screening for endocrine, metabolic and immunity disorder       Relevant Orders   TSH   POCT glycosylated hemoglobin (Hb A1C) (Completed)   CBC with Differential   Comprehensive metabolic panel   Lipid screening       Relevant Orders   Lipid panel   Chronic pain of right knee       Relevant Orders   Uric Acid   DG Knee Complete 4 Views Right (Completed)      No orders of the defined types were placed in this encounter.   Follow-up: No follow-ups on file.   PLAN  r knee joint line tenderness and swelling. No warmth. No limit in ROM or evidence of DVT  Refill bp med  Xray of R knee shows swelling, normal joint space, no osseous abnormality. Will draw uric acid for gout, otherwise, suggest supportive shows, RICE method, and tylenol regular dosing.  Patient encouraged to call clinic with any questions, comments, or concerns.  Maximiano Coss, NP

## 2019-10-11 LAB — LIPID PANEL
Chol/HDL Ratio: 3.1 ratio (ref 0.0–5.0)
Cholesterol, Total: 194 mg/dL (ref 100–199)
HDL: 62 mg/dL (ref >39)
LDL Chol Calc (NIH): 119 mg/dL — ABNORMAL HIGH (ref 0–99)
Triglycerides: 71 mg/dL (ref 0–149)
VLDL Cholesterol Cal: 13 mg/dL (ref 5–40)

## 2019-10-11 LAB — CBC WITH DIFFERENTIAL/PLATELET
Basophils Absolute: 0 10*3/uL (ref 0.0–0.2)
Basos: 1 %
EOS (ABSOLUTE): 0.1 10*3/uL (ref 0.0–0.4)
Eos: 4 %
Hematocrit: 42.5 % (ref 37.5–51.0)
Hemoglobin: 14.1 g/dL (ref 13.0–17.7)
Immature Grans (Abs): 0 10*3/uL (ref 0.0–0.1)
Immature Granulocytes: 0 %
Lymphocytes Absolute: 1.2 10*3/uL (ref 0.7–3.1)
Lymphs: 43 %
MCH: 27.6 pg (ref 26.6–33.0)
MCHC: 33.2 g/dL (ref 31.5–35.7)
MCV: 83 fL (ref 79–97)
Monocytes Absolute: 0.4 10*3/uL (ref 0.1–0.9)
Monocytes: 13 %
Neutrophils Absolute: 1.1 10*3/uL — ABNORMAL LOW (ref 1.4–7.0)
Neutrophils: 39 %
Platelets: 243 10*3/uL (ref 150–450)
RBC: 5.1 x10E6/uL (ref 4.14–5.80)
RDW: 14.1 % (ref 11.6–15.4)
WBC: 2.8 10*3/uL — ABNORMAL LOW (ref 3.4–10.8)

## 2019-10-11 LAB — COMPREHENSIVE METABOLIC PANEL
ALT: 26 IU/L (ref 0–44)
AST: 18 IU/L (ref 0–40)
Albumin/Globulin Ratio: 1.5 (ref 1.2–2.2)
Albumin: 4.1 g/dL (ref 3.8–4.9)
Alkaline Phosphatase: 66 IU/L (ref 39–117)
BUN/Creatinine Ratio: 14 (ref 9–20)
BUN: 15 mg/dL (ref 6–24)
Bilirubin Total: 1 mg/dL (ref 0.0–1.2)
CO2: 25 mmol/L (ref 20–29)
Calcium: 9.3 mg/dL (ref 8.7–10.2)
Chloride: 104 mmol/L (ref 96–106)
Creatinine, Ser: 1.07 mg/dL (ref 0.76–1.27)
GFR calc Af Amer: 90 mL/min/{1.73_m2} (ref >59)
GFR calc non Af Amer: 78 mL/min/{1.73_m2} (ref >59)
Globulin, Total: 2.7 g/dL (ref 1.5–4.5)
Glucose: 99 mg/dL (ref 65–99)
Potassium: 4.4 mmol/L (ref 3.5–5.2)
Sodium: 139 mmol/L (ref 134–144)
Total Protein: 6.8 g/dL (ref 6.0–8.5)

## 2019-10-11 LAB — URIC ACID: Uric Acid: 8 mg/dL (ref 3.8–8.4)

## 2019-10-11 LAB — TSH: TSH: 0.505 u[IU]/mL (ref 0.450–4.500)

## 2019-10-14 ENCOUNTER — Other Ambulatory Visit: Payer: Self-pay

## 2019-10-14 ENCOUNTER — Telehealth: Payer: Self-pay | Admitting: Family Medicine

## 2019-10-14 DIAGNOSIS — I10 Essential (primary) hypertension: Secondary | ICD-10-CM

## 2019-10-14 MED ORDER — VALSARTAN-HYDROCHLOROTHIAZIDE 160-25 MG PO TABS: 1.0000 | ORAL_TABLET | Freq: Every day | ORAL | 0 refills | Status: DC

## 2019-10-14 MED ORDER — VALSARTAN-HYDROCHLOROTHIAZIDE 160-25 MG PO TABS: 1.0000 | ORAL_TABLET | Freq: Every day | ORAL | 1 refills | Status: DC

## 2019-10-14 NOTE — Telephone Encounter (Signed)
Pt called about refill for valsartan-hydrochlorothiazide (DIOVAN-HCT) 160-25 MG tablet  /Pt was seen by Dr. Orland Mustard on Friday/ please advise / Pt also inquired about xray results

## 2019-10-16 ENCOUNTER — Encounter: Payer: Self-pay | Admitting: Registered Nurse

## 2019-10-16 ENCOUNTER — Other Ambulatory Visit: Payer: Self-pay

## 2019-10-24 ENCOUNTER — Ambulatory Visit (INDEPENDENT_AMBULATORY_CARE_PROVIDER_SITE_OTHER): Payer: BC Managed Care – PPO | Admitting: Registered Nurse

## 2019-10-24 ENCOUNTER — Encounter: Payer: Self-pay | Admitting: Registered Nurse

## 2019-10-24 ENCOUNTER — Other Ambulatory Visit: Payer: Self-pay

## 2019-10-24 VITALS — BP 115/82 | HR 60 | Temp 97.5°F | Resp 17 | Ht 67.0 in | Wt 152.2 lb

## 2019-10-24 DIAGNOSIS — G8929 Other chronic pain: Secondary | ICD-10-CM

## 2019-10-24 DIAGNOSIS — M25561 Pain in right knee: Secondary | ICD-10-CM | POA: Diagnosis not present

## 2019-10-24 MED ORDER — MELOXICAM 15 MG PO TABS: 15.0000 mg | ORAL_TABLET | Freq: Every day | ORAL | 0 refills | Status: DC

## 2019-10-24 NOTE — Patient Instructions (Signed)
° ° ° °  If you have lab work done today you will be contacted with your lab results within the next 2 weeks.  If you have not heard from us then please contact us. The fastest way to get your results is to register for My Chart. ° ° °IF you received an x-ray today, you will receive an invoice from [facility]. Please contact [facility] at [phone] with questions or concerns regarding your invoice.  ° °IF you received labwork today, you will receive an invoice from [facility]. Please contact [facility] at [phone] with questions or concerns regarding your invoice.  ° °Our billing staff will not be able to assist you with questions regarding bills from these companies. ° °You will be contacted with the lab results as soon as they are available. The fastest way to get your results is to activate your My Chart account. Instructions are located on the last page of this paperwork. If you have not heard from us regarding the results in 2 weeks, please contact this office. °  ° ° ° °

## 2019-10-24 NOTE — Progress Notes (Signed)
Established Patient Office Visit  Subjective:  Patient ID: Theodore Rodriguez, male    DOB: August 22, 1965  Age: 54 y.o. MRN: [id number]  CC:  Chief Complaint  Patient presents with  . Leg Pain    patient states he came for right leg pain and had a xray and nothing was wrong , but now it keeps popping , swelling , and keeping him up at night. Per patient he has not taking any medication for pain    HPI Theodore Rodriguez presents for ongoing knee pain.  Swelling and joint line tenderness in R knee. Worse with activity. Starting to feel like knee is locking. Still no radiation to hip or ankle. Has tried conservative measures and supportive shoes with no improvement - in fact, has worsened in previous two weeks.  Of note, around 1-2 years ago joint became septic with staph infection. This was treated and resolved. No heat or redness in joint today. No recent wounds or breaks in the skin.  No further concerns.  Past Medical History:  Diagnosis Date  . Diabetes mellitus without complication ([place])   . Hypertension     No past surgical history on file.  Family History  Problem Relation Age of Onset  . Hypertension Mother   . Colon cancer Maternal Aunt   . Colon cancer Maternal Uncle   . Esophageal cancer Neg Hx   . Stomach cancer Neg Hx   . Rectal cancer Neg Hx     Social History   Socioeconomic History  . Marital status: Married    Spouse name: Not on file  . Number of children: Not on file  . Years of education: Not on file  . Highest education level: Not on file  Occupational History  . Not on file  Tobacco Use  . Smoking status: Never Smoker  . Smokeless tobacco: Never Used  Substance and Sexual Activity  . Alcohol use: Yes    Comment: occasionally  . Drug use: No  . Sexual activity: Not on file  Other Topics Concern  . Not on file  Social History Narrative  . Not on file   Social Determinants of Health   Financial Resource Strain:   . Difficulty of Paying Living  Expenses:   Food Insecurity:   . Worried About Charity fundraiser in the Last Year:   . Arboriculturist in the Last Year:   Transportation Needs:   . Film/video editor (Medical):   Marland Kitchen Lack of Transportation (Non-Medical):   Physical Activity:   . Days of Exercise per Week:   . Minutes of Exercise per Session:   Stress:   . Feeling of Stress :   Social Connections:   . Frequency of Communication with Friends and Family:   . Frequency of Social Gatherings with Friends and Family:   . Attends Religious Services:   . Active Member of Clubs or Organizations:   . Attends Archivist Meetings:   Marland Kitchen Marital Status:   Intimate Partner Violence:   . Fear of Current or Ex-Partner:   . Emotionally Abused:   Marland Kitchen Physically Abused:   . Sexually Abused:     Outpatient Medications Prior to Visit  Medication Sig Dispense Refill  . valsartan-hydrochlorothiazide (DIOVAN-HCT) 160-25 MG tablet Take 1 tablet by mouth daily. 90 tablet 1  . doxycycline (VIBRAMYCIN) 100 MG capsule Take 1 capsule (100 mg total) by mouth 2 (two) times daily. (Patient not taking: Reported on 08/13/2019) 20 capsule 0  .  metFORMIN (GLUCOPHAGE) 500 MG tablet Take 1 tablet (500 mg total) by mouth 2 (two) times daily with a meal. (Patient not taking: Reported on 08/13/2019) 180 tablet 1  . methocarbamol (ROBAXIN) 500 MG tablet Take 1 tablet (500 mg total) by mouth 4 (four) times daily. (Patient not taking: Reported on 10/10/2019) 40 tablet 0  . meloxicam (MOBIC) 7.5 MG tablet Take 1 tablet (7.5 mg total) by mouth daily. (Patient not taking: Reported on 10/10/2019) 30 tablet 0   No facility-administered medications prior to visit.    No Known Allergies  ROS Review of Systems  Constitutional: Negative.   HENT: Negative.   Eyes: Negative.   Respiratory: Negative.   Cardiovascular: Negative.   Gastrointestinal: Negative.   Endocrine: Negative.   Genitourinary: Negative.   Musculoskeletal: Positive for arthralgias  (r knee). Negative for back pain, gait problem, joint swelling, myalgias, neck pain and neck stiffness.  Skin: Negative.   Allergic/Immunologic: Negative.   Neurological: Negative.   Hematological: Negative.   Psychiatric/Behavioral: Negative.   All other systems reviewed and are negative.     Objective:    Physical Exam  Constitutional: He is oriented to person, place, and time. He appears well-developed and well-nourished. No distress.  HENT:  Head: Normocephalic and atraumatic.  Cardiovascular: Normal rate and regular rhythm.  Pulmonary/Chest: Effort normal. No respiratory distress.  Musculoskeletal:        General: Tenderness and edema (suprapatellar, generalized) present. No deformity.     Comments: Limited ROM in R knee due to swelling and pain  Neurological: He is alert and oriented to person, place, and time.  Skin: Skin is warm and dry. No rash noted. He is not diaphoretic. No erythema. No pallor.  Psychiatric: He has a normal mood and affect. His behavior is normal. Judgment and thought content normal.  Nursing note and vitals reviewed.   BP 115/82   Pulse 60   Temp (!) 97.5 F (36.4 C) (Temporal)   Resp 17   Ht 5\' 7"  (1.702 m)   Wt 152 lb 3.2 oz (69 kg)   SpO2 100%   BMI 23.84 kg/m  Wt Readings from Last 3 Encounters:  10/24/19 152 lb 3.2 oz (69 kg)  10/10/19 153 lb (69.4 kg)  08/13/19 153 lb 3.2 oz (69.5 kg)     There are no preventive care reminders to display for this patient.  There are no preventive care reminders to display for this patient.  Lab Results  Component Value Date   TSH 0.505 10/10/2019   Lab Results  Component Value Date   WBC 2.8 (L) 10/10/2019   HGB 14.1 10/10/2019   HCT 42.5 10/10/2019   MCV 83 10/10/2019   PLT 243 10/10/2019   Lab Results  Component Value Date   NA 139 10/10/2019   K 4.4 10/10/2019   CO2 25 10/10/2019   GLUCOSE 99 10/10/2019   BUN 15 10/10/2019   CREATININE 1.07 10/10/2019   BILITOT 1.0 10/10/2019     ALKPHOS 66 10/10/2019   AST 18 10/10/2019   ALT 26 10/10/2019   PROT 6.8 10/10/2019   ALBUMIN 4.1 10/10/2019   CALCIUM 9.3 10/10/2019   ANIONGAP 9 05/20/2017   Lab Results  Component Value Date   CHOL 194 10/10/2019   Lab Results  Component Value Date   HDL 62 10/10/2019   Lab Results  Component Value Date   LDLCALC 119 (H) 10/10/2019   Lab Results  Component Value Date   TRIG 71 10/10/2019  Lab Results  Component Value Date   CHOLHDL 3.1 10/10/2019   Lab Results  Component Value Date   HGBA1C 6.0 (A) 10/10/2019      Assessment & Plan:   Problem List Items Addressed This Visit    None    Visit Diagnoses    Chronic pain of right knee    -  Primary   Relevant Medications   meloxicam (MOBIC) 15 MG tablet   Other Relevant Orders   For home use only DME Other see comment   Ambulatory referral to Orthopedic Surgery      Meds ordered this encounter  Medications  . meloxicam (MOBIC) 15 MG tablet    Sig: Take 1 tablet (15 mg total) by mouth daily.    Dispense:  30 tablet    Refill:  0    Order Specific Question:   Supervising Provider    Answer:   Forrest Moron [id number]    Follow-up: No follow-ups on file.   PLAN  Conservative measures failed.   xrays show effusion but no osseous abnormality. Will refer to ortho for further work up  meloxicam po qd for relief in the mean time  Patient encouraged to call clinic with any questions, comments, or concerns.  Maximiano Coss, NP

## 2019-10-29 ENCOUNTER — Other Ambulatory Visit: Payer: Self-pay

## 2019-10-29 DIAGNOSIS — R7303 Prediabetes: Secondary | ICD-10-CM

## 2019-10-29 NOTE — Telephone Encounter (Signed)
Fax refill request received from [facility] pharmacy for metformin 500 mg 1 bid , last filled 11/07/18

## 2019-10-31 MED ORDER — METFORMIN HCL 500 MG PO TABS: 500.0000 mg | ORAL_TABLET | Freq: Two times a day (BID) | ORAL | 1 refills | Status: DC

## 2019-11-06 DIAGNOSIS — M25561 Pain in right knee: Secondary | ICD-10-CM | POA: Insufficient documentation

## 2020-01-12 ENCOUNTER — Ambulatory Visit (INDEPENDENT_AMBULATORY_CARE_PROVIDER_SITE_OTHER): Payer: BC Managed Care – PPO | Admitting: Emergency Medicine

## 2020-01-12 ENCOUNTER — Encounter: Payer: Self-pay | Admitting: Emergency Medicine

## 2020-01-12 ENCOUNTER — Other Ambulatory Visit: Payer: Self-pay

## 2020-01-12 ENCOUNTER — Ambulatory Visit (INDEPENDENT_AMBULATORY_CARE_PROVIDER_SITE_OTHER): Payer: BC Managed Care – PPO

## 2020-01-12 VITALS — BP 111/69 | HR 94 | Temp 97.6°F | Ht 66.0 in | Wt 156.0 lb

## 2020-01-12 DIAGNOSIS — S93602A Unspecified sprain of left foot, initial encounter: Secondary | ICD-10-CM

## 2020-01-12 DIAGNOSIS — S99922A Unspecified injury of left foot, initial encounter: Secondary | ICD-10-CM | POA: Diagnosis not present

## 2020-01-12 DIAGNOSIS — M79672 Pain in left foot: Secondary | ICD-10-CM | POA: Diagnosis not present

## 2020-01-12 NOTE — Patient Instructions (Addendum)
If you have lab work done today you will be contacted with your lab results within the next 2 weeks.  If you have not heard from [place] then please contact us. The fastest way to get your results is to register for My Chart.   IF you received an x-ray today, you will receive an invoice from [facility]. Please contact [facility] at [phone] with questions or concerns regarding your invoice.   IF you received labwork today, you will receive an invoice from [facility]. Please contact [facility] at [phone] with questions or concerns regarding your invoice.   Our billing staff will not be able to assist you with questions regarding bills from these companies.  You will be contacted with the lab results as soon as they are available. The fastest way to get your results is to activate your My Chart account. Instructions are located on the last page of this paperwork. If you have not heard from [place] regarding the results in 2 weeks, please contact this office.      Foot Sprain  A foot sprain is an injury to one of the strong bands of tissue (ligaments) that connect and support the bones in your feet. The ligament can be stretched too much and tear. A tear can be either partial or complete. The severity of the sprain depends on how much of the ligament was damaged or torn. What are the causes? This condition is usually caused by suddenly twisting or pivoting your foot. What increases the risk? This injury is more likely to occur in people who:  Play a sport, such as basketball or football.  Exercise or play a sport without warming up.  Start a new workout or sport.  Suddenly increase how long or hard they exercise or play a sport.  Have previously injured their foot or ankle. What are the signs or symptoms? Symptoms of this condition start soon after an injury and include:  Pain, especially in the arch of the foot.  Bruising.  Swelling.  Inability to walk or  use the foot to support body weight. How is this diagnosed? This condition is diagnosed with a medical history and physical exam. You may also have imaging tests, such as:  X-rays to make sure there are no broken bones (fractures).  An MRI to see if the ligament is torn. How is this treated? Treatment for this condition depends on the severity of the sprain.  Mild sprains can be treated with: ? Rest, ice, compression, and elevation (RICE). ? Keeping your foot in a fixed position (immobilization) for a period of time. This is done if your ligament is overstretched or partially torn. Your health care provider will apply a bandage, splint, or walking boot to keep your foot from moving until it heals. ? Using crutches or a scooter for a few weeks to avoid putting weight on your foot while it is healing.  Major sprains can be treated with: ? Surgery. This is done if your ligament is fully torn and a procedure is needed to reconnect it to the bone. ? A cast or splint. This will be needed after surgery. A cast or splint will need to stay on your foot while it heals.  In both types of sprains, you may need to exercise or have physical therapy to strengthen your foot. Follow these instructions at home: If you have a bandage, splint, or boot:  Wear the bandage, splint, or boot as told by your health  care provider. Remove only as told by your health care provider.  Loosen the bandage, splint, or boot if your toes tingle, become numb, or turn cold and blue.  Keep the bandage, splint, or boot clean and dry. If you have a cast:  Do not stick anything inside the cast to scratch your skin. Doing that increases your risk for infection.  Check the skin around the cast every day. Tell your health care provider about any concerns.  You may put lotion on dry skin around the edges of the cast. Do not put lotion on the skin underneath the cast.  Keep the cast clean and dry. Bathing  Do not take  baths, swim, or use a hot tub until your health care provider approves. Ask your health care provider if you may take showers. You may only be allowed to take sponge baths.  If the bandage, splint, boot, or cast is not waterproof: ? Do not let it get wet. ? Cover it with a watertight covering when you take a shower. Managing pain, stiffness, and swelling   If directed, put ice on the injured area: ? If you have a removable splint, boot, or immobilizer, remove it as told by your health care provider. ? Put ice in a plastic bag. ? Place a towel between your skin and the bag. ? Leave the ice on for 20 minutes, 2-3 times per day.  Move your toes often to avoid stiffness and to lessen swelling.  Raise (elevate) the injured area above the level of your heart while you are sitting or lying down. Driving  Do not drive or operate heavy machinery while taking pain medicine.  Ask your health care provider when it is safe to drive if you have a bandage, splint, or walking boot on your foot. Activity  Do not use the injured foot to support your body weight until your health care provider says that you can. Use crutches or other supportive devices as directed by your health care provider.  Ask your health care provider what activities are safe for you. Do any exercise or physical therapy as directed.  Gradually increase how much and how far you walk until your health care provider says it is safe to return to full activity. General instructions  If you have a cast, do not put pressure on any part of it until it is fully hardened. This may take several hours.  Take over-the-counter and prescription medicines only as told by your health care provider.  When you can walk without pain, wear supportive shoes that have stiff soles. Do not wear flip-flops, and do not walk barefoot.  Keep all follow-up visits as told by your health care provider. This is important. Contact a health care provider  if:  Your pain is not controlled with medicine.  Your bruising or swelling gets worse or does not get better with treatment.  Your splint, boot, or cast is damaged. Get help right away if:  You develop severe numbness or tingling in your foot.  Your foot turns blue, white, or gray, and it feels cold. Summary  A foot sprain is an injury to one of the strong bands of tissue (ligaments) that connect and support the bones in your feet.  Your health care provider may recommend a splint or boot for your foot to support it while it heals. In some cases, surgery may be needed.  Physical therapy can help keep your other muscles strong until your foot gets better.  This information is not intended to replace advice given to you by your health care provider. Make sure you discuss any questions you have with your health care provider. Document Revised: 05/19/2017 Document Reviewed: 05/19/2017 Elsevier Patient Education  2020 [employer].

## 2020-01-12 NOTE — Progress Notes (Signed)
Theodore Rodriguez 54 y.o.   Chief Complaint  Patient presents with  . Fall  . left ankle pain    pressure and sensitive today     HISTORY OF PRESENT ILLNESS: This is a 54 y.o. male complaining of left foot pain following injury yesterday.  HPI   Prior to Admission medications   Medication Sig Start Date End Date Taking? Authorizing Provider  valsartan-hydrochlorothiazide (DIOVAN-HCT) 160-25 MG tablet Take 1 tablet by mouth daily. 10/14/19  Yes Maximiano Coss, NP  meloxicam (MOBIC) 15 MG tablet Take 1 tablet (15 mg total) by mouth daily. Patient not taking: Reported on 01/12/2020 10/24/19   Maximiano Coss, NP  metFORMIN (GLUCOPHAGE) 500 MG tablet Take 1 tablet (500 mg total) by mouth 2 (two) times daily with a meal. Patient not taking: Reported on 01/12/2020 10/31/19   Maximiano Coss, NP  methocarbamol (ROBAXIN) 500 MG tablet Take 1 tablet (500 mg total) by mouth 4 (four) times daily. Patient not taking: Reported on 10/10/2019 08/13/19   Maximiano Coss, NP    No Known Allergies  Patient Active Problem List   Diagnosis Date Noted  . Essential hypertension 05/09/2017  . Nonspecific abnormal electrocardiogram (ECG) (EKG) 05/09/2017  . Prediabetes 05/09/2017    Past Medical History:  Diagnosis Date  . Diabetes mellitus without complication ([place])   . Hypertension     History reviewed. No pertinent surgical history.  Social History   Socioeconomic History  . Marital status: Married    Spouse name: Not on file  . Number of children: Not on file  . Years of education: Not on file  . Highest education level: Not on file  Occupational History  . Not on file  Tobacco Use  . Smoking status: Never Smoker  . Smokeless tobacco: Never Used  Vaping Use  . Vaping Use: Never used  Substance and Sexual Activity  . Alcohol use: Yes    Comment: occasionally  . Drug use: No  . Sexual activity: Not on file  Other Topics Concern  . Not on file  Social History Narrative  . Not on file    Social Determinants of Health   Financial Resource Strain:   . Difficulty of Paying Living Expenses:   Food Insecurity:   . Worried About Charity fundraiser in the Last Year:   . Arboriculturist in the Last Year:   Transportation Needs:   . Film/video editor (Medical):   Marland Kitchen Lack of Transportation (Non-Medical):   Physical Activity:   . Days of Exercise per Week:   . Minutes of Exercise per Session:   Stress:   . Feeling of Stress :   Social Connections:   . Frequency of Communication with Friends and Family:   . Frequency of Social Gatherings with Friends and Family:   . Attends Religious Services:   . Active Member of Clubs or Organizations:   . Attends Archivist Meetings:   Marland Kitchen Marital Status:   Intimate Partner Violence:   . Fear of Current or Ex-Partner:   . Emotionally Abused:   Marland Kitchen Physically Abused:   . Sexually Abused:     Family History  Problem Relation Age of Onset  . Hypertension Mother   . Colon cancer Maternal Aunt   . Colon cancer Maternal Uncle   . Esophageal cancer Neg Hx   . Stomach cancer Neg Hx   . Rectal cancer Neg Hx      Review of Systems  Constitutional: Negative.  Negative for chills and fever.  HENT: Negative.  Negative for congestion and sore throat.   Respiratory: Negative.  Negative for cough and shortness of breath.   Cardiovascular: Negative.  Negative for chest pain and palpitations.  Gastrointestinal: Negative.  Negative for abdominal pain, diarrhea, nausea and vomiting.  Genitourinary: Negative.  Negative for dysuria and hematuria.  Musculoskeletal: Negative.  Negative for myalgias and neck pain.  Skin: Negative.  Negative for rash.  Neurological: Negative for dizziness and headaches.  All other systems reviewed and are negative.   Today's Vitals   01/12/20 1444  BP: 111/69  Pulse: 94  Temp: 97.6 F (36.4 C)  TempSrc: Temporal  SpO2: 98%  Weight: 156 lb (70.8 kg)  Height: 5\' 6"  (1.676 m)   Body mass  index is 25.18 kg/m.  Physical Exam Vitals reviewed.  Constitutional:      Appearance: Normal appearance.  HENT:     Head: Normocephalic.  Eyes:     Extraocular Movements: Extraocular movements intact.     Pupils: Pupils are equal, round, and reactive to light.  Cardiovascular:     Rate and Rhythm: Normal rate.  Pulmonary:     Effort: Pulmonary effort is normal.  Musculoskeletal:     Cervical back: Normal range of motion.     Comments: Left foot: Positive swelling and tenderness to proximal fifth metatarsal area.  Neurovascularly intact. Left ankle: Full range of motion, within normal limits.  Skin:    General: Skin is warm and dry.     Capillary Refill: Capillary refill takes less than 2 seconds.  Neurological:     General: No focal deficit present.     Mental Status: He is alert and oriented to person, place, and time.  Psychiatric:        Mood and Affect: Mood normal.        Behavior: Behavior normal.    DG Foot Complete Left  Result Date: 01/12/2020 CLINICAL DATA:  Status post trauma. EXAM: LEFT FOOT - COMPLETE 3+ VIEW COMPARISON:  None. FINDINGS: There is no evidence of fracture or dislocation. There is no evidence of arthropathy or other focal bone abnormality. Soft tissues are unremarkable. IMPRESSION: Negative. Electronically Signed   By: Virgina Norfolk M.D.   On: 01/12/2020 15:18     ASSESSMENT & PLAN: Theodore Rodriguez was seen today for fall and left ankle pain.  Diagnoses and all orders for this visit:  Foot injury, left, initial encounter -     DG Foot Complete Left  Left foot pain  Foot sprain, left, initial encounter    Patient Instructions       If you have lab work done today you will be contacted with your lab results within the next 2 weeks.  If you have not heard from [place] then please contact us. The fastest way to get your results is to register for My Chart.   IF you received an x-ray today, you will receive an invoice from [facility].  Please contact [facility] at [phone] with questions or concerns regarding your invoice.   IF you received labwork today, you will receive an invoice from [place]. Please contact [facility] at [phone] with questions or concerns regarding your invoice.   Our billing staff will not be able to assist you with questions regarding bills from these companies.  You will be contacted with the lab results as soon as they are available. The fastest way to get your results is to activate your My Chart account. Instructions  are located on the last page of this paperwork. If you have not heard from [place] regarding the results in 2 weeks, please contact this office.      Foot Sprain  A foot sprain is an injury to one of the strong bands of tissue (ligaments) that connect and support the bones in your feet. The ligament can be stretched too much and tear. A tear can be either partial or complete. The severity of the sprain depends on how much of the ligament was damaged or torn. What are the causes? This condition is usually caused by suddenly twisting or pivoting your foot. What increases the risk? This injury is more likely to occur in people who:  Play a sport, such as basketball or football.  Exercise or play a sport without warming up.  Start a new workout or sport.  Suddenly increase how long or hard they exercise or play a sport.  Have previously injured their foot or ankle. What are the signs or symptoms? Symptoms of this condition start soon after an injury and include:  Pain, especially in the arch of the foot.  Bruising.  Swelling.  Inability to walk or use the foot to support body weight. How is this diagnosed? This condition is diagnosed with a medical history and physical exam. You may also have imaging tests, such as:  X-rays to make sure there are no broken bones (fractures).  An MRI to see if the ligament is torn. How is this treated? Treatment for this  condition depends on the severity of the sprain.  Mild sprains can be treated with: ? Rest, ice, compression, and elevation (RICE). ? Keeping your foot in a fixed position (immobilization) for a period of time. This is done if your ligament is overstretched or partially torn. Your health care provider will apply a bandage, splint, or walking boot to keep your foot from moving until it heals. ? Using crutches or a scooter for a few weeks to avoid putting weight on your foot while it is healing.  Major sprains can be treated with: ? Surgery. This is done if your ligament is fully torn and a procedure is needed to reconnect it to the bone. ? A cast or splint. This will be needed after surgery. A cast or splint will need to stay on your foot while it heals.  In both types of sprains, you may need to exercise or have physical therapy to strengthen your foot. Follow these instructions at home: If you have a bandage, splint, or boot:  Wear the bandage, splint, or boot as told by your health care provider. Remove only as told by your health care provider.  Loosen the bandage, splint, or boot if your toes tingle, become numb, or turn cold and blue.  Keep the bandage, splint, or boot clean and dry. If you have a cast:  Do not stick anything inside the cast to scratch your skin. Doing that increases your risk for infection.  Check the skin around the cast every day. Tell your health care provider about any concerns.  You may put lotion on dry skin around the edges of the cast. Do not put lotion on the skin underneath the cast.  Keep the cast clean and dry. Bathing  Do not take baths, swim, or use a hot tub until your health care provider approves. Ask your health care provider if you may take showers. You may only be allowed to take sponge baths.  If the bandage,  splint, boot, or cast is not waterproof: ? Do not let it get wet. ? Cover it with a watertight covering when you take a  shower. Managing pain, stiffness, and swelling   If directed, put ice on the injured area: ? If you have a removable splint, boot, or immobilizer, remove it as told by your health care provider. ? Put ice in a plastic bag. ? Place a towel between your skin and the bag. ? Leave the ice on for 20 minutes, 2-3 times per day.  Move your toes often to avoid stiffness and to lessen swelling.  Raise (elevate) the injured area above the level of your heart while you are sitting or lying down. Driving  Do not drive or operate heavy machinery while taking pain medicine.  Ask your health care provider when it is safe to drive if you have a bandage, splint, or walking boot on your foot. Activity  Do not use the injured foot to support your body weight until your health care provider says that you can. Use crutches or other supportive devices as directed by your health care provider.  Ask your health care provider what activities are safe for you. Do any exercise or physical therapy as directed.  Gradually increase how much and how far you walk until your health care provider says it is safe to return to full activity. General instructions  If you have a cast, do not put pressure on any part of it until it is fully hardened. This may take several hours.  Take over-the-counter and prescription medicines only as told by your health care provider.  When you can walk without pain, wear supportive shoes that have stiff soles. Do not wear flip-flops, and do not walk barefoot.  Keep all follow-up visits as told by your health care provider. This is important. Contact a health care provider if:  Your pain is not controlled with medicine.  Your bruising or swelling gets worse or does not get better with treatment.  Your splint, boot, or cast is damaged. Get help right away if:  You develop severe numbness or tingling in your foot.  Your foot turns blue, white, or gray, and it feels  cold. Summary  A foot sprain is an injury to one of the strong bands of tissue (ligaments) that connect and support the bones in your feet.  Your health care provider may recommend a splint or boot for your foot to support it while it heals. In some cases, surgery may be needed.  Physical therapy can help keep your other muscles strong until your foot gets better. This information is not intended to replace advice given to you by your health care provider. Make sure you discuss any questions you have with your health care provider. Document Revised: 05/19/2017 Document Reviewed: 05/19/2017 Elsevier Patient Education  2020 Elsevier Inc.      Agustina Caroli, MD [facility]

## 2020-07-15 ENCOUNTER — Other Ambulatory Visit: Payer: Self-pay | Admitting: Registered Nurse

## 2020-07-15 DIAGNOSIS — I10 Essential (primary) hypertension: Secondary | ICD-10-CM

## 2020-07-22 ENCOUNTER — Other Ambulatory Visit: Payer: Self-pay | Admitting: Registered Nurse

## 2020-07-22 ENCOUNTER — Telehealth: Payer: Self-pay | Admitting: Registered Nurse

## 2020-07-22 DIAGNOSIS — I10 Essential (primary) hypertension: Secondary | ICD-10-CM

## 2020-07-22 NOTE — Telephone Encounter (Signed)
Requested medication (s) are due for refill today: yes courtesy refill of 8 tabs given per patient from PCP  Requested medication (s) are on the active medication list: yes  Last refill:  07/15/20 #30 0 refills  Future visit scheduled: no  Notes to clinic:  called patient and patient reports he is out of regular Rx medications and was given 8  tabs by PCP. Instructed patient to call clinic to schedule appt for more refills. Patient reports he will call clinic in am to set up appt. Do you want 30 day 2nd courtesy refill?     Requested Prescriptions  Pending Prescriptions Disp Refills   valsartan-hydrochlorothiazide (DIOVAN-HCT) 160-25 MG tablet 30 tablet 0    Sig: Take 1 tablet by mouth daily.      Cardiovascular: ARB + Diuretic Combos Failed - 07/22/2020  4:34 PM      Failed - K in normal range and within 180 days    Potassium  Date Value Ref Range Status  10/10/2019 4.4 3.5 - 5.2 mmol/L Final          Failed - Na in normal range and within 180 days    Sodium  Date Value Ref Range Status  10/10/2019 139 134 - 144 mmol/L Final          Failed - Cr in normal range and within 180 days    Creatinine, Ser  Date Value Ref Range Status  10/10/2019 1.07 0.76 - 1.27 mg/dL Final          Failed - Ca in normal range and within 180 days    Calcium  Date Value Ref Range Status  10/10/2019 9.3 8.7 - 10.2 mg/dL Final   Calcium, Ion  Date Value Ref Range Status  05/20/2017 1.12 (L) 1.15 - 1.40 mmol/L Final          Failed - Valid encounter within last 6 months    Recent Outpatient Visits           6 months ago Foot injury, left, initial encounter   [facility] at [facility] Ines Bloomer, MD   9 months ago Chronic pain of right knee   [facility], Delfino Lovett, NP   9 months ago Essential hypertension   [facility], Delfino Lovett, NP   11 months ago Strain of right groin   [facility], Delfino Lovett, NP   1 year ago Abscess of  knee, left   [facility] at [facility], Fenton Malling, MD                Passed - Patient is not pregnant      Passed - Last BP in normal range    BP Readings from Last 1 Encounters:  01/12/20 111/69

## 2020-07-22 NOTE — Telephone Encounter (Signed)
Medication: valsartan-hydrochlorothiazide (DIOVAN-HCT) 160-25 MG tablet [[id number]]   Has the patient contacted their pharmacy? YES (Agent: If no, request that the patient contact the pharmacy for the refill.) (Agent: If yes, when and what did the pharmacy advise?)  Preferred Pharmacy (with phone number or street name): [facility], [place] [address] [place] [address] Phone: [phone] Fax: [phone] Hours: Not open 24 hours    Agent: Please be advised that RX refills may take up to 3 business days. We ask that you follow-up with your pharmacy.

## 2020-07-23 MED ORDER — VALSARTAN-HYDROCHLOROTHIAZIDE 160-25 MG PO TABS: 1.0000 | ORAL_TABLET | Freq: Every day | ORAL | 0 refills | Status: DC

## 2020-07-23 NOTE — Telephone Encounter (Signed)
Pt called for appt during lunch and would like a call back to schedule his appointment [phone]

## 2020-07-23 NOTE — Telephone Encounter (Signed)
Pt scheduled will get refill at appt next week

## 2020-07-28 ENCOUNTER — Other Ambulatory Visit: Payer: Self-pay

## 2020-07-28 ENCOUNTER — Ambulatory Visit (INDEPENDENT_AMBULATORY_CARE_PROVIDER_SITE_OTHER): Payer: PRIVATE HEALTH INSURANCE | Admitting: Registered Nurse

## 2020-07-28 ENCOUNTER — Encounter: Payer: Self-pay | Admitting: Registered Nurse

## 2020-07-28 ENCOUNTER — Other Ambulatory Visit: Payer: Self-pay | Admitting: Registered Nurse

## 2020-07-28 VITALS — BP 141/93 | HR 79 | Temp 98.0°F | Resp 18 | Ht 66.0 in | Wt 166.2 lb

## 2020-07-28 DIAGNOSIS — I10 Essential (primary) hypertension: Secondary | ICD-10-CM

## 2020-07-28 DIAGNOSIS — Z13220 Encounter for screening for lipoid disorders: Secondary | ICD-10-CM

## 2020-07-28 DIAGNOSIS — Z130 Encounter for screening for diseases of the blood and blood-forming organs and certain disorders involving the immune mechanism: Secondary | ICD-10-CM

## 2020-07-28 DIAGNOSIS — Z13228 Encounter for screening for other metabolic disorders: Secondary | ICD-10-CM

## 2020-07-28 DIAGNOSIS — Z1329 Encounter for screening for other suspected endocrine disorder: Secondary | ICD-10-CM

## 2020-07-28 MED ORDER — VALSARTAN-HYDROCHLOROTHIAZIDE 160-25 MG PO TABS: 1.0000 | ORAL_TABLET | Freq: Every day | ORAL | 1 refills | Status: DC

## 2020-07-28 NOTE — Patient Instructions (Signed)
° ° ° °  If you have lab work done today you will be contacted with your lab results within the next 2 weeks.  If you have not heard from us then please contact us. The fastest way to get your results is to register for My Chart. ° ° °IF you received an x-ray today, you will receive an invoice from [facility]. Please contact [facility] at [phone] with questions or concerns regarding your invoice.  ° °IF you received labwork today, you will receive an invoice from [facility]. Please contact [facility] at [phone] with questions or concerns regarding your invoice.  ° °Our billing staff will not be able to assist you with questions regarding bills from these companies. ° °You will be contacted with the lab results as soon as they are available. The fastest way to get your results is to activate your My Chart account. Instructions are located on the last page of this paperwork. If you have not heard from us regarding the results in 2 weeks, please contact this office. °  ° ° ° °

## 2020-07-28 NOTE — Telephone Encounter (Signed)
Copied from [place] [phone]. Topic: Quick Communication - Rx Refill/Question >> Jul 28, 2020  3:43 PM Tessa Lerner A wrote: Medication: valsartan-hydrochlorothiazide (DIOVAN-HCT) 160-25 MG tablet  Has the patient contacted their pharmacy? Yes. Patient was instructed to contact PCP  Preferred Pharmacy (with phone number or street name): [facility], [address]  Phone:  [phone]  Agent: Please be advised that RX refills may take up to 3 business days. We ask that you follow-up with your pharmacy.

## 2020-07-29 LAB — LIPID PANEL
Chol/HDL Ratio: 3.2 ratio (ref 0.0–5.0)
Cholesterol, Total: 184 mg/dL (ref 100–199)
HDL: 58 mg/dL (ref >39)
LDL Chol Calc (NIH): 114 mg/dL — ABNORMAL HIGH (ref 0–99)
Triglycerides: 64 mg/dL (ref 0–149)
VLDL Cholesterol Cal: 12 mg/dL (ref 5–40)

## 2020-07-29 LAB — CBC WITH DIFFERENTIAL
Basophils Absolute: 0 10*3/uL (ref 0.0–0.2)
Basos: 1 %
EOS (ABSOLUTE): 0.2 10*3/uL (ref 0.0–0.4)
Eos: 4 %
Hematocrit: 44 % (ref 37.5–51.0)
Hemoglobin: 14.6 g/dL (ref 13.0–17.7)
Immature Grans (Abs): 0.1 10*3/uL (ref 0.0–0.1)
Immature Granulocytes: 1 %
Lymphocytes Absolute: 2 10*3/uL (ref 0.7–3.1)
Lymphs: 42 %
MCH: 27.8 pg (ref 26.6–33.0)
MCHC: 33.2 g/dL (ref 31.5–35.7)
MCV: 84 fL (ref 79–97)
Monocytes Absolute: 0.6 10*3/uL (ref 0.1–0.9)
Monocytes: 13 %
Neutrophils Absolute: 1.8 10*3/uL (ref 1.4–7.0)
Neutrophils: 39 %
RBC: 5.25 x10E6/uL (ref 4.14–5.80)
RDW: 14.5 % (ref 11.6–15.4)
WBC: 4.7 10*3/uL (ref 3.4–10.8)

## 2020-07-29 LAB — COMPREHENSIVE METABOLIC PANEL
ALT: 27 IU/L (ref 0–44)
AST: 17 IU/L (ref 0–40)
Albumin/Globulin Ratio: 1.5 (ref 1.2–2.2)
Albumin: 4.3 g/dL (ref 3.8–4.9)
Alkaline Phosphatase: 74 IU/L (ref 44–121)
BUN/Creatinine Ratio: 21 — ABNORMAL HIGH (ref 9–20)
BUN: 26 mg/dL — ABNORMAL HIGH (ref 6–24)
Bilirubin Total: 0.4 mg/dL (ref 0.0–1.2)
CO2: 25 mmol/L (ref 20–29)
Calcium: 9.8 mg/dL (ref 8.7–10.2)
Chloride: 99 mmol/L (ref 96–106)
Creatinine, Ser: 1.25 mg/dL (ref 0.76–1.27)
Globulin, Total: 2.8 g/dL (ref 1.5–4.5)
Glucose: 99 mg/dL (ref 65–99)
Potassium: 5 mmol/L (ref 3.5–5.2)
Sodium: 140 mmol/L (ref 134–144)
Total Protein: 7.1 g/dL (ref 6.0–8.5)
eGFR: 68 mL/min/{1.73_m2} (ref >59)

## 2020-07-29 LAB — HEMOGLOBIN A1C
Est. average glucose Bld gHb Est-mCnc: 137 mg/dL
Hgb A1c MFr Bld: 6.4 % — ABNORMAL HIGH (ref 4.8–5.6)

## 2020-07-29 LAB — TSH: TSH: 0.49 u[IU]/mL (ref 0.450–4.500)

## 2020-10-12 ENCOUNTER — Telehealth: Payer: Self-pay | Admitting: Registered Nurse

## 2020-10-12 NOTE — Telephone Encounter (Signed)
Encounter started in error.

## 2020-10-20 ENCOUNTER — Ambulatory Visit: Payer: BC Managed Care – PPO | Admitting: Registered Nurse

## 2020-10-21 ENCOUNTER — Ambulatory Visit: Payer: Self-pay | Admitting: Registered Nurse

## 2020-11-30 ENCOUNTER — Telehealth (INDEPENDENT_AMBULATORY_CARE_PROVIDER_SITE_OTHER): Payer: Self-pay | Admitting: Registered Nurse

## 2020-11-30 ENCOUNTER — Encounter: Payer: Self-pay | Admitting: Registered Nurse

## 2020-11-30 ENCOUNTER — Other Ambulatory Visit: Payer: Self-pay

## 2020-11-30 DIAGNOSIS — R1114 Bilious vomiting: Secondary | ICD-10-CM

## 2020-11-30 DIAGNOSIS — R12 Heartburn: Secondary | ICD-10-CM

## 2020-11-30 DIAGNOSIS — R059 Cough, unspecified: Secondary | ICD-10-CM

## 2020-11-30 MED ORDER — ONDANSETRON HCL 4 MG PO TABS: 4.0000 mg | ORAL_TABLET | Freq: Three times a day (TID) | ORAL | 0 refills | Status: DC | PRN

## 2020-11-30 MED ORDER — SUCRALFATE 1 G PO TABS: 1.0000 g | ORAL_TABLET | Freq: Three times a day (TID) | ORAL | 0 refills | Status: DC

## 2020-11-30 MED ORDER — PANTOPRAZOLE SODIUM 40 MG PO TBEC: 40.0000 mg | DELAYED_RELEASE_TABLET | Freq: Every day | ORAL | 3 refills | Status: DC

## 2020-11-30 MED ORDER — AZITHROMYCIN 250 MG PO TABS: ORAL_TABLET | ORAL | 0 refills | Status: AC

## 2020-11-30 NOTE — Progress Notes (Signed)
Telemedicine Encounter- SOAP NOTE Established Patient  This telephone encounter was conducted with the patient's (or proxy's) verbal consent via audio telecommunications: yes/no: Yes Patient was instructed to have this encounter in a suitably private space; and to only have persons present to whom they give permission to participate. In addition, patient identity was confirmed by use of name plus two identifiers (DOB and address).  I discussed the limitations, risks, security and privacy concerns of performing an evaluation and management service by telephone and the availability of in person appointments. I also discussed with the patient that there may be a patient responsible charge related to this service. The patient expressed understanding and agreed to proceed.  I spent a total of 16 minutes talking with the patient or their proxy.  Patient at home Provider in office  Participants: Kathrin Ruddy, NP and Lavada Mesi  Chief Complaint  Patient presents with   Diarrhea    Patient states since last Wednesday he ate something that made him sick. Patient states he woke up with severe vomiting, diarrhea, cough and some congestion. Patient states he has took a home covid test two days ago and it was negative but took another one today at a college site and will know in 2 days    He has been taking some OTC cold medication    Subjective   Theodore Rodriguez is a 55 y.o. established patient. Telephone visit today for nvd, cough  HPI Woke up Thursday with severe vomiting, diarrhea. Some nausea Cough and congestion started Friday Did note a burger that he ate may have caused the vomiting  Ongoing nausea with eating any meals. Coughing after meals - thick clear mucus  Diarrhea 2-3 days  No blood in stool or vomit  Did have one episode of epistaxis  Denies shob, doe, headache, loc, near syncope, dizziness. Some mild abdominal pain  Notes persistent heartburn for a number of days prior  to onset of nvd and congestion  Has not tried any treatment. Has been limiting meals because of fear of symptoms.  Patient Active Problem List   Diagnosis Date Noted   Essential hypertension 05/09/2017   Nonspecific abnormal electrocardiogram (ECG) (EKG) 05/09/2017   Prediabetes 05/09/2017    Past Medical History:  Diagnosis Date   Diabetes mellitus without complication ([facility])    Hypertension     Current Outpatient Medications  Medication Sig Dispense Refill   valsartan-hydrochlorothiazide (DIOVAN-HCT) 160-25 MG tablet Take 1 tablet by mouth daily. 90 tablet 1   meloxicam (MOBIC) 15 MG tablet Take 1 tablet (15 mg total) by mouth daily. (Patient not taking: No sig reported) 30 tablet 0   metFORMIN (GLUCOPHAGE) 500 MG tablet Take 1 tablet (500 mg total) by mouth 2 (two) times daily with a meal. (Patient not taking: No sig reported) 180 tablet 1   methocarbamol (ROBAXIN) 500 MG tablet Take 1 tablet (500 mg total) by mouth 4 (four) times daily. (Patient not taking: No sig reported) 40 tablet 0   No current facility-administered medications for this visit.    No Known Allergies  Social History   Socioeconomic History   Marital status: Married    Spouse name: Not on file   Number of children: Not on file   Years of education: Not on file   Highest education level: Not on file  Occupational History   Not on file  Tobacco Use   Smoking status: Never   Smokeless tobacco: Never  Vaping Use   Vaping Use:  Never used  Substance and Sexual Activity   Alcohol use: Yes    Comment: occasionally   Drug use: No   Sexual activity: Not on file  Other Topics Concern   Not on file  Social History Narrative   Not on file   Social Determinants of Health   Financial Resource Strain: Not on file  Food Insecurity: Not on file  Transportation Needs: Not on file  Physical Activity: Not on file  Stress: Not on file  Social Connections: Not on file  Intimate Partner Violence: Not on file     ROS Per hpi  Objective   Vitals as reported by the patient: There were no vitals filed for this visit.  Cejay was seen today for diarrhea.  Diagnoses and all orders for this visit:  Cough -     ondansetron (ZOFRAN) 4 MG tablet; Take 1 tablet (4 mg total) by mouth every 8 (eight) hours as needed for nausea or vomiting.  Heartburn -     pantoprazole (PROTONIX) 40 MG tablet; Take 1 tablet (40 mg total) by mouth daily. -     sucralfate (CARAFATE) 1 g tablet; Take 1 tablet (1 g total) by mouth 4 (four) times daily -  with meals and at bedtime.  Bilious vomiting with nausea -     azithromycin (ZITHROMAX) 250 MG tablet; Take 2 tablets on day 1, then 1 tablet daily on days 2 through 5  PLAN Will treat as GERD with pantoprazole, ondansetron, and sucralfate. Think this is likely contributing to cough and congestion Given the question of ingestion of infected food or potential aspiration with vomiting, will give zpack. Reviewed r/b/se of pursuing antibiotics for this course. Reviewed red flags with patient who voiced understanding. These include blood in vomit or stool, sudden unexpected weight changes, and persistent vomiting and diarrhea despite treatment Reviewed nonpharm for management of symptoms including in depth review of diet choices for GERD and nvd. Patient encouraged to call clinic with any questions, comments, or concerns.  I discussed the assessment and treatment plan with the patient. The patient was provided an opportunity to ask questions and all were answered. The patient agreed with the plan and demonstrated an understanding of the instructions.   The patient was advised to call back or seek an in-person evaluation if the symptoms worsen or if the condition fails to improve as anticipated.  I provided 16 minutes of non-face-to-face time during this encounter.  Maximiano Coss, NP  Primary Care at [facility]

## 2020-11-30 NOTE — Patient Instructions (Addendum)
Mr. Theodore Rodriguez -  I'm thinking a lot of your symptoms are related to acid in the stomach that's bouncing back to your esophagus.   Reducing this with pantoprazole and sucralfate will likely help a lot.  We'll cover for any nausea/vomiting with the ondansetron three times daily as needed.  We'll cover for any bacterial infection with a z pack (azithromycin)  Let me know if symptoms persist or worsen. In particular, I want to know if you have blood in your stool, unexpected weight loss of more than 10% of your body weight, blood in vomiting, or abdominal pain that does not resolve.  Check [employer]com for savings on medications at the pharmacy.  Thank you  Rich     If you have lab work done today you will be contacted with your lab results within the next 2 weeks.  If you have not heard from [place] then please contact us. The fastest way to get your results is to register for My Chart.   IF you received an x-ray today, you will receive an invoice from [facility]. Please contact [facility] at [phone] with questions or concerns regarding your invoice.   IF you received labwork today, you will receive an invoice from [place]. Please contact [facility] at [phone] with questions or concerns regarding your invoice.   Our billing staff will not be able to assist you with questions regarding bills from these companies.  You will be contacted with the lab results as soon as they are available. The fastest way to get your results is to activate your My Chart account. Instructions are located on the last page of this paperwork. If you have not heard from [place] regarding the results in 2 weeks, please contact this office.

## 2021-01-11 ENCOUNTER — Other Ambulatory Visit: Payer: Self-pay | Admitting: Registered Nurse

## 2021-01-11 DIAGNOSIS — R7303 Prediabetes: Secondary | ICD-10-CM

## 2021-01-28 ENCOUNTER — Ambulatory Visit (INDEPENDENT_AMBULATORY_CARE_PROVIDER_SITE_OTHER): Payer: BC Managed Care – PPO | Admitting: Registered Nurse

## 2021-01-28 ENCOUNTER — Encounter: Payer: Self-pay | Admitting: Registered Nurse

## 2021-01-28 ENCOUNTER — Other Ambulatory Visit: Payer: Self-pay

## 2021-01-28 VITALS — BP 130/88 | HR 72 | Temp 98.1°F | Resp 18 | Ht 66.0 in | Wt 162.4 lb

## 2021-01-28 DIAGNOSIS — Z13220 Encounter for screening for lipoid disorders: Secondary | ICD-10-CM

## 2021-01-28 DIAGNOSIS — R7303 Prediabetes: Secondary | ICD-10-CM | POA: Diagnosis not present

## 2021-01-28 DIAGNOSIS — Z23 Encounter for immunization: Secondary | ICD-10-CM | POA: Diagnosis not present

## 2021-01-28 DIAGNOSIS — I10 Essential (primary) hypertension: Secondary | ICD-10-CM

## 2021-01-28 MED ORDER — VALSARTAN-HYDROCHLOROTHIAZIDE 320-25 MG PO TABS: 1.0000 | ORAL_TABLET | Freq: Every day | ORAL | 1 refills | Status: DC

## 2021-01-28 NOTE — Patient Instructions (Signed)
Mr. Bula -  Good to see you  Blood pressure still a little above goal. A small increase in medication can help  Labs today should be back tomorrow. I'll call with any urgent concerns.  Let me know if you need anything, otherwise see you in 6 months.   Thank you  Rich

## 2021-01-28 NOTE — Progress Notes (Signed)
Established Patient Office Visit  Subjective:  Patient ID: Theodore Rodriguez, male    DOB: 10-02-1965  Age: 55 y.o. MRN: [id number]  CC:  Chief Complaint  Patient presents with   Hypertension    6 month recheck    HPI Thurlow Gallaga presents for htn  Hypertension: Patient Currently taking: valsartan-hctz 160-19m po qd Good effect. No AEs. Denies CV symptoms including: chest pain, shob, doe, headache, visual changes, fatigue, claudication, and dependent edema.   Previous readings and labs: BP Readings from Last 3 Encounters:  01/28/21 130/88  07/28/20 (!) 141/93  01/12/20 111/69   Lab Results  Component Value Date   CREATININE 1.25 07/28/2020       Past Medical History:  Diagnosis Date   [place]    Hypertension     History reviewed. No pertinent surgical history.  Family History  Problem Relation Age of Onset   Hypertension Mother    Colon cancer Maternal Aunt    Colon cancer Maternal Uncle    Esophageal cancer Neg Hx    Stomach cancer Neg Hx    Rectal cancer Neg Hx     Social History   Socioeconomic History   Marital status: Married    Spouse name: Not on file   Number of children: Not on file   Years of education: Not on file   Highest education level: Not on file  Occupational History   Not on file  Tobacco Use   Smoking status: Never   Smokeless tobacco: Never  Vaping Use   Vaping Use: Never used  Substance and Sexual Activity   Alcohol use: Yes    Comment: occasionally   Drug use: No   Sexual activity: Not on file  Other Topics Concern   Not on file  Social History Narrative   Not on file   Social Determinants of Health   Financial Resource Strain: Not on file  Food Insecurity: Not on file  Transportation Needs: Not on file  Physical Activity: Not on file  Stress: Not on file  Social Connections: Not on file  Intimate Partner Violence: Not on file    Outpatient Medications Prior to Visit   Medication Sig Dispense Refill   valsartan-hydrochlorothiazide (DIOVAN-HCT) 160-25 MG tablet Take 1 tablet by mouth daily. 90 tablet 1   meloxicam (MOBIC) 15 MG tablet Take 1 tablet (15 mg total) by mouth daily. (Patient not taking: No sig reported) 30 tablet 0   metFORMIN (GLUCOPHAGE) 500 MG tablet TAKE 1 TABLET(500 MG) BY MOUTH TWICE DAILY WITH A MEAL (Patient not taking: Reported on 01/28/2021) 180 tablet 1   methocarbamol (ROBAXIN) 500 MG tablet Take 1 tablet (500 mg total) by mouth 4 (four) times daily. (Patient not taking: No sig reported) 40 tablet 0   ondansetron (ZOFRAN) 4 MG tablet Take 1 tablet (4 mg total) by mouth every 8 (eight) hours as needed for nausea or vomiting. (Patient not taking: Reported on 01/28/2021) 20 tablet 0   pantoprazole (PROTONIX) 40 MG tablet Take 1 tablet (40 mg total) by mouth daily. (Patient not taking: Reported on 01/28/2021) 30 tablet 3   sucralfate (CARAFATE) 1 g tablet Take 1 tablet (1 g total) by mouth 4 (four) times daily -  with meals and at bedtime. (Patient not taking: Reported on 01/28/2021) 40 tablet 0   No facility-administered medications prior to visit.    No Known Allergies  ROS Review of Systems  Constitutional: Negative.   HENT: Negative.  Eyes: Negative.   Respiratory: Negative.    Cardiovascular: Negative.   Gastrointestinal: Negative.   Genitourinary: Negative.   Musculoskeletal: Negative.   Skin: Negative.   Neurological: Negative.   Psychiatric/Behavioral: Negative.    All other systems reviewed and are negative.    Objective:    Physical Exam Constitutional:      General: He is not in acute distress.    Appearance: Normal appearance. He is normal weight. He is not ill-appearing, toxic-appearing or diaphoretic.  Cardiovascular:     Rate and Rhythm: Normal rate and regular rhythm.     Heart sounds: Normal heart sounds. No murmur heard.   No friction rub. No gallop.  Pulmonary:     Effort: Pulmonary effort is normal. No  respiratory distress.     Breath sounds: Normal breath sounds. No stridor. No wheezing, rhonchi or rales.  Chest:     Chest wall: No tenderness.  Neurological:     General: No focal deficit present.     Mental Status: He is alert and oriented to person, place, and time. Mental status is at baseline.  Psychiatric:        Mood and Affect: Mood normal.        Behavior: Behavior normal.        Thought Content: Thought content normal.        Judgment: Judgment normal.    BP 130/88   Pulse 72   Temp 98.1 F (36.7 C) (Temporal)   Resp 18   Ht _0  (1.676 m)   Wt 162 lb 6.4 oz (73.7 kg)   SpO2 98%   BMI 26.21 kg/m  Wt Readings from Last 3 Encounters:  01/28/21 162 lb 6.4 oz (73.7 kg)  07/28/20 166 lb 3.2 oz (75.4 kg)  01/12/20 156 lb (70.8 kg)     There are no preventive care reminders to display for this patient.   There are no preventive care reminders to display for this patient.  Lab Results  Component Value Date   TSH 0.490 07/28/2020   Lab Results  Component Value Date   WBC 4.7 07/28/2020   HGB 14.6 07/28/2020   HCT 44.0 07/28/2020   MCV 84 07/28/2020   PLT 243 10/10/2019   Lab Results  Component Value Date   NA 140 07/28/2020   K 5.0 07/28/2020   CO2 25 07/28/2020   GLUCOSE 99 07/28/2020   BUN 26 (H) 07/28/2020   CREATININE 1.25 07/28/2020   BILITOT 0.4 07/28/2020   ALKPHOS 74 07/28/2020   AST 17 07/28/2020   ALT 27 07/28/2020   PROT 7.1 07/28/2020   ALBUMIN 4.3 07/28/2020   CALCIUM 9.8 07/28/2020   ANIONGAP 9 05/20/2017   EGFR 68 07/28/2020   Lab Results  Component Value Date   CHOL 184 07/28/2020   Lab Results  Component Value Date   HDL 58 07/28/2020   Lab Results  Component Value Date   LDLCALC 114 (H) 07/28/2020   Lab Results  Component Value Date   TRIG 64 07/28/2020   Lab Results  Component Value Date   CHOLHDL 3.2 07/28/2020   Lab Results  Component Value Date   HGBA1C 6.4 (H) 07/28/2020      Assessment & Plan:    Problem List Items Addressed This Visit       Cardiovascular and Mediastinum   Essential hypertension - Primary   Relevant Medications   valsartan-hydrochlorothiazide (DIOVAN-HCT) 320-25 MG tablet   Other Relevant Orders   Comprehensive metabolic  panel     Other   Prediabetes   Relevant Orders   Hemoglobin A1c   Other Visit Diagnoses     Lipid screening       Relevant Orders   Lipid panel   Flu vaccine need       Relevant Orders   Flu Vaccine QUAD 6+ mos PF IM (Fluarix Quad PF) (Completed)       Meds ordered this encounter  Medications   valsartan-hydrochlorothiazide (DIOVAN-HCT) 320-25 MG tablet    Sig: Take 1 tablet by mouth daily.    Dispense:  90 tablet    Refill:  1    Order Specific Question:   Supervising Provider    Answer:   Carlota Raspberry, JEFFREY R [[id number]]    Follow-up: Return in about 6 months (around 07/28/2021) for CPE, HTN follow up .   PLAN Increase antihypertensives to valsartan-hctz 320-92m po qd. BP borderline today, high in past. Goal is below 130/80. Return in 6 mo Labs collected. Will follow up with the patient as warranted. Patient encouraged to call clinic with any questions, comments, or concerns.  RMaximiano Coss NP

## 2021-04-11 ENCOUNTER — Telehealth: Payer: Self-pay | Admitting: Registered Nurse

## 2021-04-11 DIAGNOSIS — I10 Essential (primary) hypertension: Secondary | ICD-10-CM

## 2021-04-13 NOTE — Progress Notes (Signed)
Established Patient Office Visit  Subjective:  Patient ID: Theodore Rodriguez, male    DOB: Sep 05, 1965  Age: 55 y.o. MRN: [id number]  CC:  Chief Complaint  Patient presents with   Medication Refill    Patient state he is here for medication refill for valsartan . Patient has no questions or concerns    HPI Theodore Rodriguez presents for htn  Hypertension: Patient Currently taking: valsartan-hctz 160-56m po qd Good effect generally, though BP borderline today. No AEs. Denies CV symptoms including: chest pain, shob, doe, headache, visual changes, fatigue, claudication, and dependent edema.   Previous readings and labs: BP Readings from Last 3 Encounters:  01/28/21 130/88  07/28/20 (!) 141/93  01/12/20 111/69   Lab Results  Component Value Date   CREATININE 1.25 07/28/2020       Past Medical History:  Diagnosis Date   Diabetes mellitus without complication ([place]    Hypertension     No past surgical history on file.  Family History  Problem Relation Age of Onset   Hypertension Mother    Colon cancer Maternal Aunt    Colon cancer Maternal Uncle    Esophageal cancer Neg Hx    Stomach cancer Neg Hx    Rectal cancer Neg Hx     Social History   Socioeconomic History   Marital status: Married    Spouse name: Not on file   Number of children: Not on file   Years of education: Not on file   Highest education level: Not on file  Occupational History   Not on file  Tobacco Use   Smoking status: Never   Smokeless tobacco: Never  Vaping Use   Vaping Use: Never used  Substance and Sexual Activity   Alcohol use: Yes    Comment: occasionally   Drug use: No   Sexual activity: Not on file  Other Topics Concern   Not on file  Social History Narrative   Not on file   Social Determinants of Health   Financial Resource Strain: Not on file  Food Insecurity: Not on file  Transportation Needs: Not on file  Physical Activity: Not on file  Stress: Not on file  Social  Connections: Not on file  Intimate Partner Violence: Not on file    Outpatient Medications Prior to Visit  Medication Sig Dispense Refill   valsartan-hydrochlorothiazide (DIOVAN-HCT) 160-25 MG tablet Take 1 tablet by mouth daily. 30 tablet 0   meloxicam (MOBIC) 15 MG tablet Take 1 tablet (15 mg total) by mouth daily. (Patient not taking: No sig reported) 30 tablet 0   methocarbamol (ROBAXIN) 500 MG tablet Take 1 tablet (500 mg total) by mouth 4 (four) times daily. (Patient not taking: No sig reported) 40 tablet 0   metFORMIN (GLUCOPHAGE) 500 MG tablet Take 1 tablet (500 mg total) by mouth 2 (two) times daily with a meal. (Patient not taking: No sig reported) 180 tablet 1   No facility-administered medications prior to visit.    No Known Allergies  ROS Review of Systems  Constitutional: Negative.   HENT: Negative.    Eyes: Negative.   Respiratory: Negative.    Cardiovascular: Negative.   Gastrointestinal: Negative.   Genitourinary: Negative.   Musculoskeletal: Negative.   Skin: Negative.   Neurological: Negative.   Psychiatric/Behavioral: Negative.    All other systems reviewed and are negative.    Objective:    Physical Exam Constitutional:      General: He is not in acute distress.  Appearance: Normal appearance. He is normal weight. He is not ill-appearing, toxic-appearing or diaphoretic.  Cardiovascular:     Rate and Rhythm: Normal rate and regular rhythm.     Heart sounds: Normal heart sounds. No murmur heard.   No friction rub. No gallop.  Pulmonary:     Effort: Pulmonary effort is normal. No respiratory distress.     Breath sounds: Normal breath sounds. No stridor. No wheezing, rhonchi or rales.  Chest:     Chest wall: No tenderness.  Neurological:     General: No focal deficit present.     Mental Status: He is alert and oriented to person, place, and time. Mental status is at baseline.  Psychiatric:        Mood and Affect: Mood normal.        Behavior:  Behavior normal.        Thought Content: Thought content normal.        Judgment: Judgment normal.    BP (!) 141/93   Pulse 79   Temp 98 F (36.7 C) (Temporal)   Resp 18   Ht _0  (1.676 m)   Wt 166 lb 3.2 oz (75.4 kg)   SpO2 98%   BMI 26.83 kg/m  Wt Readings from Last 3 Encounters:  01/28/21 162 lb 6.4 oz (73.7 kg)  07/28/20 166 lb 3.2 oz (75.4 kg)  01/12/20 156 lb (70.8 kg)     Health Maintenance Due  Topic Date Due   Zoster Vaccines- Shingrix (1 of 2) Never done    There are no preventive care reminders to display for this patient.  Lab Results  Component Value Date   TSH 0.490 07/28/2020   Lab Results  Component Value Date   WBC 4.7 07/28/2020   HGB 14.6 07/28/2020   HCT 44.0 07/28/2020   MCV 84 07/28/2020   PLT 243 10/10/2019   Lab Results  Component Value Date   NA 140 07/28/2020   K 5.0 07/28/2020   CO2 25 07/28/2020   GLUCOSE 99 07/28/2020   BUN 26 (H) 07/28/2020   CREATININE 1.25 07/28/2020   BILITOT 0.4 07/28/2020   ALKPHOS 74 07/28/2020   AST 17 07/28/2020   ALT 27 07/28/2020   PROT 7.1 07/28/2020   ALBUMIN 4.3 07/28/2020   CALCIUM 9.8 07/28/2020   ANIONGAP 9 05/20/2017   EGFR 68 07/28/2020   Lab Results  Component Value Date   CHOL 184 07/28/2020   Lab Results  Component Value Date   HDL 58 07/28/2020   Lab Results  Component Value Date   LDLCALC 114 (H) 07/28/2020   Lab Results  Component Value Date   TRIG 64 07/28/2020   Lab Results  Component Value Date   CHOLHDL 3.2 07/28/2020   Lab Results  Component Value Date   HGBA1C 6.4 (H) 07/28/2020      Assessment & Plan:   Problem List Items Addressed This Visit       Cardiovascular and Mediastinum   Essential hypertension   Other Visit Diagnoses     Screening for endocrine, metabolic and immunity disorder    -  Primary   Relevant Orders   CBC With Differential (Completed)   Comprehensive metabolic panel (Completed)   Hemoglobin A1c (Completed)   TSH  (Completed)   Uncontrolled hypertension       Lipid screening       Relevant Orders   Lipid panel (Completed)       Meds ordered this encounter  Medications  DISCONTD: valsartan-hydrochlorothiazide (DIOVAN-HCT) 160-25 MG tablet    Sig: Take 1 tablet by mouth daily.    Dispense:  90 tablet    Refill:  1    Follow-up: No follow-ups on file.   PLAN Refill meds x 6 mo. Discussed lifestyle changes including diet and exercise to better control HTN. Uncontrolled at this time. Will consider increase on medication at future visit. Labs collected. Will follow up with the patient as warranted. Return in 3 mo Patient encouraged to call clinic with any questions, comments, or concerns.  Maximiano Coss, NP

## 2021-04-25 MED ORDER — VALSARTAN-HYDROCHLOROTHIAZIDE 320-25 MG PO TABS: 1.0000 | ORAL_TABLET | Freq: Every day | ORAL | 1 refills | Status: DC

## 2021-04-25 NOTE — Telephone Encounter (Signed)
Attempted to call patient because he should have medication left. Pt was prescribed a 90 day supply in September.

## 2021-04-25 NOTE — Telephone Encounter (Signed)
Patient called and states that he needs his valsartan refilled but that the pharmacy told him that they need a prior authorization.  Please send [facility] [address] in [place], [place]  - Patient has 6 pills left.

## 2021-04-25 NOTE — Telephone Encounter (Signed)
Patient called back and said that he has 6 pills left, and that he doesn't want to get the refill until these 6 are gone, but he will still need a prior authorization for his next refill.

## 2021-04-25 NOTE — Addendum Note (Signed)
Addended by: Maximiano Coss on: 04/25/2021 04:24 PM   Modules accepted: Orders

## 2021-04-25 NOTE — Telephone Encounter (Signed)
Order queued to release on time.  Thanks,  Denice Paradise

## 2021-07-28 ENCOUNTER — Encounter: Payer: Self-pay | Admitting: Registered Nurse

## 2021-07-28 ENCOUNTER — Other Ambulatory Visit: Payer: Self-pay

## 2021-07-28 ENCOUNTER — Ambulatory Visit (INDEPENDENT_AMBULATORY_CARE_PROVIDER_SITE_OTHER): Payer: BC Managed Care – PPO | Admitting: Registered Nurse

## 2021-07-28 VITALS — BP 132/89 | HR 70 | Temp 97.7°F | Resp 18 | Ht 66.0 in | Wt 155.8 lb

## 2021-07-28 DIAGNOSIS — I10 Essential (primary) hypertension: Secondary | ICD-10-CM

## 2021-07-28 DIAGNOSIS — H547 Unspecified visual loss: Secondary | ICD-10-CM

## 2021-07-28 DIAGNOSIS — F458 Other somatoform disorders: Secondary | ICD-10-CM

## 2021-07-28 DIAGNOSIS — N529 Male erectile dysfunction, unspecified: Secondary | ICD-10-CM

## 2021-07-28 DIAGNOSIS — Z0000 Encounter for general adult medical examination without abnormal findings: Secondary | ICD-10-CM | POA: Diagnosis not present

## 2021-07-28 DIAGNOSIS — R7303 Prediabetes: Secondary | ICD-10-CM

## 2021-07-28 DIAGNOSIS — Z125 Encounter for screening for malignant neoplasm of prostate: Secondary | ICD-10-CM

## 2021-07-28 LAB — CBC WITH DIFFERENTIAL/PLATELET
Basophils Absolute: 0 10*3/uL (ref 0.0–0.1)
Basophils Relative: 0.8 % (ref 0.0–3.0)
Eosinophils Absolute: 0.1 10*3/uL (ref 0.0–0.7)
Eosinophils Relative: 3.2 % (ref 0.0–5.0)
HCT: 45.6 % (ref 39.0–52.0)
Hemoglobin: 14.7 g/dL (ref 13.0–17.0)
Lymphocytes Relative: 42.1 % (ref 12.0–46.0)
Lymphs Abs: 1.8 10*3/uL (ref 0.7–4.0)
MCHC: 32.3 g/dL (ref 30.0–36.0)
MCV: 87.4 fl (ref 78.0–100.0)
Monocytes Absolute: 0.4 10*3/uL (ref 0.1–1.0)
Monocytes Relative: 8.7 % (ref 3.0–12.0)
Neutro Abs: 1.9 10*3/uL (ref 1.4–7.7)
Neutrophils Relative %: 45.2 % (ref 43.0–77.0)
Platelets: 237 10*3/uL (ref 150.0–400.0)
RBC: 5.21 Mil/uL (ref 4.22–5.81)
RDW: 15.1 % (ref 11.5–15.5)
WBC: 4.3 10*3/uL (ref 4.0–10.5)

## 2021-07-28 LAB — TSH: TSH: 0.46 u[IU]/mL (ref 0.35–5.50)

## 2021-07-28 LAB — COMPREHENSIVE METABOLIC PANEL
ALT: 35 U/L (ref 0–53)
AST: 29 U/L (ref 0–37)
Albumin: 4.2 g/dL (ref 3.5–5.2)
Alkaline Phosphatase: 72 U/L (ref 39–117)
BUN: 17 mg/dL (ref 6–23)
CO2: 30 mEq/L (ref 19–32)
Calcium: 9.1 mg/dL (ref 8.4–10.5)
Chloride: 103 mEq/L (ref 96–112)
Creatinine, Ser: 1.05 mg/dL (ref 0.40–1.50)
GFR: 79.25 mL/min (ref >60.00)
Glucose, Bld: 84 mg/dL (ref 70–99)
Potassium: 4.4 mEq/L (ref 3.5–5.1)
Sodium: 141 mEq/L (ref 135–145)
Total Bilirubin: 0.8 mg/dL (ref 0.2–1.2)
Total Protein: 7.1 g/dL (ref 6.0–8.3)

## 2021-07-28 LAB — LIPID PANEL
Cholesterol: 188 mg/dL (ref 0–200)
HDL: 75.2 mg/dL (ref >39.00)
LDL Cholesterol: 103 mg/dL — ABNORMAL HIGH (ref 0–99)
NonHDL: 112.6
Total CHOL/HDL Ratio: 2
Triglycerides: 47 mg/dL (ref 0.0–149.0)
VLDL: 9.4 mg/dL (ref 0.0–40.0)

## 2021-07-28 LAB — PSA: PSA: 2.13 ng/mL (ref 0.10–4.00)

## 2021-07-28 LAB — HEMOGLOBIN A1C: Hgb A1c MFr Bld: 6.5 % (ref 4.6–6.5)

## 2021-07-28 MED ORDER — CYCLOBENZAPRINE HCL 5 MG PO TABS: 5.0000 mg | ORAL_TABLET | Freq: Three times a day (TID) | ORAL | 1 refills | Status: DC | PRN

## 2021-07-28 MED ORDER — SILDENAFIL CITRATE 50 MG PO TABS: 50.0000 mg | ORAL_TABLET | Freq: Every day | ORAL | 3 refills | Status: DC | PRN

## 2021-07-28 NOTE — Patient Instructions (Signed)
Mr. Lopp -  ? ?Always a pleasure ? ?Call if you need anything ? ?Take sildenafil once daily as needed. If insurance coverage isn't good, check out [email or web address] for coupons.  ? ?I have referred to optometry for vision assessment ? ?I have sent flexeril 5mg  to take up to three times daily as needed for TMJ / jaw pain and teeth grinding. Stretch your jaw and neck 2-3 times daily. ? ?See you in 6 mo, sooner with concerns ? ?Thank you ? ?Rich  ?

## 2021-07-28 NOTE — Progress Notes (Signed)
Established Patient Office Visit  Subjective:  Patient ID: Theodore Rodriguez, male    DOB: 09/04/1965  Age: 56 y.o. MRN: [id number]  CC:  Chief Complaint  Patient presents with   Annual Exam    Patient states he is here for a CPE and hypertension.    HPI Theodore Rodriguez presents for CPE  Hypertension: Patient Currently taking: valsartan-hcyz 325-19m po qd Good effect. No AEs. Denies CV symptoms including: chest pain, shob, doe, headache, visual changes, fatigue, claudication, and dependent edema.   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  No new complications Reports good compliance with medications Diet has been steady Exercise habits have been steady  Notes ED Ongoing. Can achieve erection, cannot maintain No hematospermia, hematuria. No pain with erections Has not used medication for this previously  Notes decreasing visual acuity Very gradual over years. Had formerly served in [employer] had been told he'd need glasses  Interested in referral to optometry.   Notes teeth grinding Some tmj pain On and off. Has not taken meds No dental pain Has not happened before No new stressors at home.   Past Medical History:  Diagnosis Date   [place]    Hypertension     History reviewed. No pertinent surgical history.  Family History  Problem Relation Age of Onset   Hypertension Mother    Colon cancer Maternal Aunt    Colon cancer Maternal Uncle    Esophageal cancer Neg Hx    Stomach cancer Neg Hx    Rectal cancer Neg Hx     Social History   Socioeconomic History   Marital status: Married    Spouse name: Not on file   Number of children: Not on file    Years of education: Not on file   Highest education level: Not on file  Occupational History   Not on file  Tobacco Use   Smoking status: Never   Smokeless tobacco: Never  Vaping Use   Vaping Use: Never used  Substance and Sexual Activity   Alcohol use: Yes    Comment: occasionally   Drug use: No   Sexual activity: Not on file  Other Topics Concern   Not on file  Social History Narrative   Not on file   Social Determinants of Health   Financial Resource Strain: Not on file  Food Insecurity: Not on file  Transportation Needs: Not on file  Physical Activity: Not on file  Stress: Not on file  Social Connections: Not on file  Intimate Partner Violence: Not on file    Outpatient Medications Prior to Visit  Medication Sig Dispense Refill   valsartan-hydrochlorothiazide (DIOVAN-HCT) 320-25 MG tablet Take 1 tablet by mouth daily. 90 tablet 1   metFORMIN (GLUCOPHAGE) 500 MG tablet TAKE 1 TABLET(500 MG) BY MOUTH TWICE DAILY WITH A MEAL (Patient not taking: Reported on 01/28/2021) 180 tablet 1   meloxicam (MOBIC) 15 MG tablet Take 1 tablet (15 mg total) by mouth daily. (Patient not taking: Reported on 01/12/2020) 30 tablet 0   methocarbamol (ROBAXIN) 500 MG tablet Take 1 tablet (500 mg total) by mouth 4 (four) times daily. (Patient not taking: Reported on 10/10/2019)  40 tablet 0   ondansetron (ZOFRAN) 4 MG tablet Take 1 tablet (4 mg total) by mouth every 8 (eight) hours as needed for nausea or vomiting. (Patient not taking: Reported on 01/28/2021) 20 tablet 0   pantoprazole (PROTONIX) 40 MG tablet Take 1 tablet (40 mg total) by mouth daily. (Patient not taking: Reported on 01/28/2021) 30 tablet 3   sucralfate (CARAFATE) 1 g tablet Take 1 tablet (1 g total) by mouth 4 (four) times daily -  with meals and at bedtime. (Patient not taking: Reported on 01/28/2021) 40 tablet 0   No facility-administered medications prior to visit.    No Known Allergies  ROS Review of Systems  Constitutional:  Negative.   HENT: Negative.    Eyes: Negative.   Respiratory: Negative.    Cardiovascular: Negative.   Gastrointestinal: Negative.   Genitourinary: Negative.   Musculoskeletal: Negative.   Skin: Negative.   Neurological: Negative.   Psychiatric/Behavioral: Negative.       Objective:    Physical Exam Vitals and nursing note reviewed.  Constitutional:      General: He is not in acute distress.    Appearance: Normal appearance. He is normal weight. He is not ill-appearing, toxic-appearing or diaphoretic.  HENT:     Head: Normocephalic and atraumatic.     Right Ear: Tympanic membrane, ear canal and external ear normal. There is no impacted cerumen.     Left Ear: Tympanic membrane, ear canal and external ear normal. There is no impacted cerumen.     Nose: Nose normal. No congestion or rhinorrhea.     Mouth/Throat:     Mouth: Mucous membranes are moist.     Pharynx: Oropharynx is clear. No oropharyngeal exudate or posterior oropharyngeal erythema.  Eyes:     General: No scleral icterus.       Right eye: No discharge.        Left eye: No discharge.     Extraocular Movements: Extraocular movements intact.     Conjunctiva/sclera: Conjunctivae normal.     Pupils: Pupils are equal, round, and reactive to light.  Neck:     Vascular: No carotid bruit.  Cardiovascular:     Rate and Rhythm: Normal rate and regular rhythm.     Pulses: Normal pulses.     Heart sounds: Normal heart sounds. No murmur heard.   No friction rub. No gallop.  Pulmonary:     Effort: Pulmonary effort is normal. No respiratory distress.     Breath sounds: Normal breath sounds. No stridor. No wheezing, rhonchi or rales.  Chest:     Chest wall: No tenderness.  Abdominal:     General: Abdomen is flat. Bowel sounds are normal. There is no distension.     Palpations: Abdomen is soft. There is no mass.     Tenderness: There is no abdominal tenderness. There is no right CVA tenderness, left CVA tenderness, guarding  or rebound.     Hernia: No hernia is present.  Musculoskeletal:        General: No swelling, tenderness, deformity or signs of injury. Normal range of motion.     Cervical back: Normal range of motion and neck supple. No rigidity or tenderness.     Right lower leg: No edema.     Left lower leg: No edema.  Lymphadenopathy:     Cervical: No cervical adenopathy.  Skin:    General: Skin is warm and dry.     Capillary Refill: Capillary refill takes less than 2  seconds.     Coloration: Skin is not jaundiced or pale.     Findings: No bruising, erythema, lesion or rash.  Neurological:     General: No focal deficit present.     Mental Status: He is alert and oriented to person, place, and time. Mental status is at baseline.     Cranial Nerves: No cranial nerve deficit.     Motor: No weakness.     Gait: Gait normal.  Psychiatric:        Mood and Affect: Mood normal.        Behavior: Behavior normal.        Thought Content: Thought content normal.        Judgment: Judgment normal.    BP 132/89    Pulse 70    Temp 97.7 F (36.5 C) (Temporal)    Resp 18    Ht '5\' 6"'  (1.676 m)    Wt 155 lb 12.8 oz (70.7 kg)    SpO2 100%    BMI 25.15 kg/m  Wt Readings from Last 3 Encounters:  07/28/21 155 lb 12.8 oz (70.7 kg)  01/28/21 162 lb 6.4 oz (73.7 kg)  07/28/20 166 lb 3.2 oz (75.4 kg)     Health Maintenance Due  Topic Date Due   Zoster Vaccines- Shingrix (1 of 2) Never done    There are no preventive care reminders to display for this patient.  Lab Results  Component Value Date   TSH 0.490 07/28/2020   Lab Results  Component Value Date   WBC 4.7 07/28/2020   HGB 14.6 07/28/2020   HCT 44.0 07/28/2020   MCV 84 07/28/2020   PLT 243 10/10/2019   Lab Results  Component Value Date   NA 140 07/28/2020   K 5.0 07/28/2020   CO2 25 07/28/2020   GLUCOSE 99 07/28/2020   BUN 26 (H) 07/28/2020   CREATININE 1.25 07/28/2020   BILITOT 0.4 07/28/2020   ALKPHOS 74 07/28/2020   AST 17 07/28/2020    ALT 27 07/28/2020   PROT 7.1 07/28/2020   ALBUMIN 4.3 07/28/2020   CALCIUM 9.8 07/28/2020   ANIONGAP 9 05/20/2017   EGFR 68 07/28/2020   Lab Results  Component Value Date   CHOL 184 07/28/2020   Lab Results  Component Value Date   HDL 58 07/28/2020   Lab Results  Component Value Date   LDLCALC 114 (H) 07/28/2020   Lab Results  Component Value Date   TRIG 64 07/28/2020   Lab Results  Component Value Date   CHOLHDL 3.2 07/28/2020   Lab Results  Component Value Date   HGBA1C 6.4 (H) 07/28/2020      Assessment & Plan:   Problem List Items Addressed This Visit       Cardiovascular and Mediastinum   Essential hypertension   Relevant Medications   sildenafil (VIAGRA) 50 MG tablet   Other Relevant Orders   CBC with Differential/Platelet   Comprehensive metabolic panel   TSH   Lipid panel     Other   Prediabetes   Relevant Orders   Hemoglobin A1c   TSH   Lipid panel   Other Visit Diagnoses     Annual physical exam    -  Primary   Screening PSA (prostate specific antigen)       Relevant Orders   PSA   Poor vision       Relevant Orders   Ambulatory referral to Optometry   Erectile dysfunction, unspecified erectile dysfunction  type       Relevant Medications   sildenafil (VIAGRA) 50 MG tablet   Teeth grinding       Relevant Medications   cyclobenzaprine (FLEXERIL) 5 MG tablet       Meds ordered this encounter  Medications   sildenafil (VIAGRA) 50 MG tablet    Sig: Take 1 tablet (50 mg total) by mouth daily as needed for erectile dysfunction.    Dispense:  30 tablet    Refill:  3    Order Specific Question:   Supervising Provider    Answer:   Carlota Raspberry, JEFFREY R [[id number]]   cyclobenzaprine (FLEXERIL) 5 MG tablet    Sig: Take 1 tablet (5 mg total) by mouth 3 (three) times daily as needed for muscle spasms.    Dispense:  30 tablet    Refill:  1    Order Specific Question:   Supervising Provider    Answer:   Carlota Raspberry, JEFFREY R [[id number]]     Follow-up: Return in about 6 months (around 01/28/2022) for HTN.   PLAN Exam unremarkable Labs collected. Will follow up with the patient as warranted. Flexeril for tmj pain. Reviewed stretching. Can consider PT if no improvement Refer to optometry for decreased visual acuity. Sildenafil 75m po qd prn for ED.  Reviewed risks of medications. Pt voices understanding. Patient encouraged to call clinic with any questions, comments, or concerns.  RMaximiano Coss NP

## 2022-01-31 ENCOUNTER — Ambulatory Visit: Payer: BC Managed Care – PPO | Admitting: Registered Nurse

## 2022-02-16 ENCOUNTER — Other Ambulatory Visit: Payer: Self-pay | Admitting: Family Medicine

## 2022-02-16 ENCOUNTER — Other Ambulatory Visit: Payer: Self-pay

## 2022-02-16 DIAGNOSIS — I10 Essential (primary) hypertension: Secondary | ICD-10-CM

## 2022-02-16 MED ORDER — VALSARTAN-HYDROCHLOROTHIAZIDE 320-25 MG PO TABS: 1.0000 | ORAL_TABLET | Freq: Every day | ORAL | 0 refills | Status: DC

## 2022-03-02 ENCOUNTER — Telehealth (INDEPENDENT_AMBULATORY_CARE_PROVIDER_SITE_OTHER): Payer: BC Managed Care – PPO | Admitting: Family Medicine

## 2022-03-02 DIAGNOSIS — R059 Cough, unspecified: Secondary | ICD-10-CM | POA: Diagnosis not present

## 2022-03-02 DIAGNOSIS — R0981 Nasal congestion: Secondary | ICD-10-CM | POA: Diagnosis not present

## 2022-03-02 MED ORDER — BENZONATATE 100 MG PO CAPS: ORAL_CAPSULE | ORAL | 0 refills | Status: DC

## 2022-03-02 MED ORDER — AMOXICILLIN-POT CLAVULANATE 875-125 MG PO TABS: 1.0000 | ORAL_TABLET | Freq: Two times a day (BID) | ORAL | 0 refills | Status: DC

## 2022-03-02 NOTE — Patient Instructions (Addendum)
---------------------------------------------------------------------------------------------------------------------------    WORK SLIP:  Patient Theodore Rodriguez,  [birth year], was seen for a medical visit today, 03/02/22 . If Covid19 testing is positive, patient will likely be contagious for an average of 7-10 days from the onset of symptoms, PLUS 1 day of no fever and improved symptoms. Will defer to employer for a sooner return to work if patient has negative covid and is feeling better, OR if symptoms have resolved or improved, it is greater than 5 days since the positive test and the patient can wear a high-quality, tight fitting mask such as N95 or KN95 at all times for an additional 5 days.  Sincerely: E-signature: Dr. Colin Benton, DO [place] Ph: [phone]   ------------------------------------------------------------------------------------------------------------------------------  -I sent the medication(s) we discussed to your pharmacy: Meds ordered this encounter  Medications   amoxicillin-clavulanate (AUGMENTIN) 875-125 MG tablet    Sig: Take 1 tablet by mouth 2 (two) times daily.    Dispense:  20 tablet    Refill:  0   benzonatate (TESSALON PERLES) 100 MG capsule    Sig: 1-2 capsules up to twice daily as needed for cough    Dispense:  20 capsule    Refill:  0   Do covid test. If positive and you desire treatment please contact a [facility] pharmacy or do a follow up virtual visit if within 5 days of when you symptoms started or worsened.   I hope you are feeling better soon!  Seek in person care promptly if your symptoms worsen, new concerns arise or you are not improving with treatment.  It was nice to meet you today. I help [facility] out with telemedicine visits on Tuesdays and Thursdays and am happy to help if you need a virtual follow up visit on those days. Otherwise, if you have any concerns or questions following this visit please  schedule a follow up visit with your Primary Care office or seek care at a local urgent care clinic to avoid delays in care. If you are having severe or life threatening symptoms please call 911 and/or go to the nearest emergency room.   Amoxicillin; Clavulanic Acid Chewable Tablets What is this medication? AMOXICILLIN; CLAVULANIC ACID (a mox i SIL in; KLAV yoo lan ic AS id) treats infections caused by bacteria. It belongs to a group of medications called penicillin antibiotics. It will not treat colds, the flu, or infections caused by viruses. This medicine may be used for other purposes; ask your health care provider or pharmacist if you have questions. COMMON BRAND NAME(S): Augmentin What should I tell my care team before I take this medication? They need to know if you have any of these conditions: Kidney disease Liver disease Mononucleosis Phenylketonuria Stomach or intestine problems such as colitis An unusual or allergic reaction to amoxicillin, other penicillin or cephalosporin antibiotics, clavulanic acid, other medications, foods, dyes, or preservatives Pregnant or trying to get pregnant Breast-feeding How should I use this medication? Take this medication by mouth. Take it as directed on the prescription label at the same time every day. Chew or crush it completely before swallowing. Do not swallow tablets whole. You can take it with or without food. If it upsets your stomach, take it with food. Take all of this medication unless your care team tells you to stop it early. Keep taking it even if you think you are better. Talk to your care team about the use of this medication in children. While it  may be prescribed for selected conditions, precautions do apply. Overdosage: If you think you have taken too much of this medicine contact a poison control center or emergency room at once. NOTE: This medicine is only for you. Do not share this medicine with others. What if I miss a  dose? If you miss a dose, take it as soon as you can. If it is almost time for your next dose, take only that dose. Do not take double or extra doses. What may interact with this medication? Allopurinol Anticoagulants Birth control pills Methotrexate Probenecid This list may not describe all possible interactions. Give your health care provider a list of all the medicines, herbs, non-prescription drugs, or dietary supplements you use. Also tell them if you smoke, drink alcohol, or use illegal drugs. Some items may interact with your medicine. What should I watch for while using this medication? Tell your care team if your symptoms do not start to get better or if they get worse. This medication may cause serious skin reactions. They can happen weeks to months after starting the medication. Contact your care team right away if you notice fevers or flu-like symptoms with a rash. The rash may be red or purple and then turn into blisters or peeling of the skin. Or, you might notice a red rash with swelling of the face, lips or lymph nodes in your neck or under your arms. This product may contain aspartame, which is a source of phenylalanine. If you have phenylketonuria (PKU), contact your care team for advice. Do not treat diarrhea with over the counter products. Contact your care team if you have diarrhea that lasts more than 2 days or if it is severe and watery. If you have diabetes, you may get a false-positive result for sugar in your urine. Check with your care team. Birth control may not work properly while you are taking this medication. Talk to your care team about using an extra method of birth control. What side effects may I notice from receiving this medication? Side effects that you should report to your care team as soon as possible: Allergic reactions--skin rash, itching, hives, swelling of the face, lips, tongue, or throat Liver injury--right upper belly pain, loss of appetite, nausea,  light-colored stool, dark yellow or brown urine, yellowing skin or eyes, unusual weakness or fatigue Redness, blistering, peeling, or loosening of the skin, including inside the mouth Severe diarrhea, fever Unusual vaginal discharge, itching, or odor Side effects that usually do not require medical attention (report to your care team if they continue or are bothersome): Diarrhea Nausea Vomiting This list may not describe all possible side effects. Call your doctor for medical advice about side effects. You may report side effects to FDA at 1-800-FDA-1088. Where should I keep my medication? Keep out of the reach of children and pets. Store at room temperature between 20 and 25 degrees C (68 and 77 degrees F). Throw away any unused medication after the expiration date. NOTE: This sheet is a summary. It may not cover all possible information. If you have questions about this medicine, talk to your doctor, pharmacist, or health care provider.  2023 Elsevier/Gold Standard (2020-05-08 00:00:00)

## 2022-03-02 NOTE — Addendum Note (Signed)
Addended by: Lucretia Kern on: 03/02/2022 05:33 PM   Modules accepted: Level of Service

## 2022-03-02 NOTE — Progress Notes (Signed)
Virtual Visit via Telephone Note  I connected with Theodore Rodriguez on 03/02/22 at  3:20 PM EDT by telephone and verified that I am speaking with the correct person using two identifiers.   I discussed the limitations of performing an evaluation and management service by telephone and requested permission for a phone visit. The patient expressed understanding and agreed to proceed.  Location patient:  [place] Location provider: work or home office Participants present for the call: patient, provider Patient did not have a visit with me in the prior 7 days to address this/these issue(s).   History of Present Illness:  Acute telemedicine visit for sinus congestion: -Onset: 2 week ago, now worsening -Symptoms include: nasal congestion, drainage, cough and now green and yellow sinus congestion, feeling like low grade fever the last few days -Denies:CP, SOB, vomiting -drinking fluids able to get around -Pertinent past medical history: see below -Pertinent medication allergies: No Known Allergies -COVID-19 vaccine status: Immunization History  Administered Date(s) Administered   Influenza Split 03/12/2013   Influenza,inj,Quad PF,6+ Mos 05/09/2017, 04/16/2018, 01/28/2021   Tdap 01/17/2015      Past Medical History:  Diagnosis Date   Diabetes mellitus without complication ([place])    Hypertension     Current Outpatient Medications on File Prior to Visit  Medication Sig Dispense Refill   cyclobenzaprine (FLEXERIL) 5 MG tablet Take 1 tablet (5 mg total) by mouth 3 (three) times daily as needed for muscle spasms. 30 tablet 1   metFORMIN (GLUCOPHAGE) 500 MG tablet TAKE 1 TABLET(500 MG) BY MOUTH TWICE DAILY WITH A MEAL 180 tablet 1   sildenafil (VIAGRA) 50 MG tablet Take 1 tablet (50 mg total) by mouth daily as needed for erectile dysfunction. 30 tablet 3   valsartan-hydrochlorothiazide (DIOVAN-HCT) 320-25 MG tablet Take 1 tablet by mouth daily. 30 tablet 0   No current facility-administered  medications on file prior to visit.    Observations/Objective: Patient sounds cheerful and well on the phone. I do not appreciate any SOB. Speech and thought processing are grossly intact. Patient reported vitals:  Assessment and Plan:  No diagnosis found.  -we discussed possible serious and likely etiologies, options for evaluation and workup, limitations of telemedicine visit vs in person visit, treatment, treatment risks and precautions. Pt prefers to treat via telemedicine empirically rather than in person at this moment. Query new VURI, covid, etc vs sinusitis vs other. He plans to do home covid test and advised can contact [facility] pharmacy or do vv follow up if positive and wishes to take antiviral. Otherwise nasal sinus rinse, Augmentin, Tessalon. Work/School slipped offered: provided in patient instructions    Advised to seek prompt virtual visit or in person care if worsening, new symptoms arise, or if is not improving with treatment as expected per our conversation of expected course. Discussed options for follow up care. Did let this patient know that I do telemedicine on Tuesdays and Thursdays for [facility] and those are the days I am logged into the system. Advised to schedule follow up visit with PCP, [facility] virtual visits or [facility] if any further questions or concerns to avoid delays in care.   I discussed the assessment and treatment plan with the patient. The patient was provided an opportunity to ask questions and all were answered. The patient agreed with the plan and demonstrated an understanding of the instructions.    Follow Up Instructions:  I did not refer this patient for an OV with me in the next 24 hours for this/these  issue(s).  I discussed the assessment and treatment plan with the patient. The patient was provided an opportunity to ask questions and all were answered. The patient agreed with the plan and demonstrated an understanding of the  instructions.   I spent 19 minutes on the date of this visit in the care of this patient. See summary of tasks completed to properly care for this patient in the detailed notes above which also included counseling of above, review of PMH, medications, allergies, evaluation of the patient and ordering and/or  instructing patient on testing and care options.     Lucretia Kern, DO

## 2022-03-18 ENCOUNTER — Other Ambulatory Visit: Payer: Self-pay | Admitting: Family Medicine

## 2022-03-18 DIAGNOSIS — I10 Essential (primary) hypertension: Secondary | ICD-10-CM

## 2022-03-29 IMAGING — DX DG KNEE COMPLETE 4+V*R*
4 series · 4 of 4 positions shown · non-contrast
Comparison: None

CLINICAL DATA: Chronic RIGHT knee pain and swelling

EXAM:
RIGHT KNEE - COMPLETE 4+ VIEW

[knee ap]
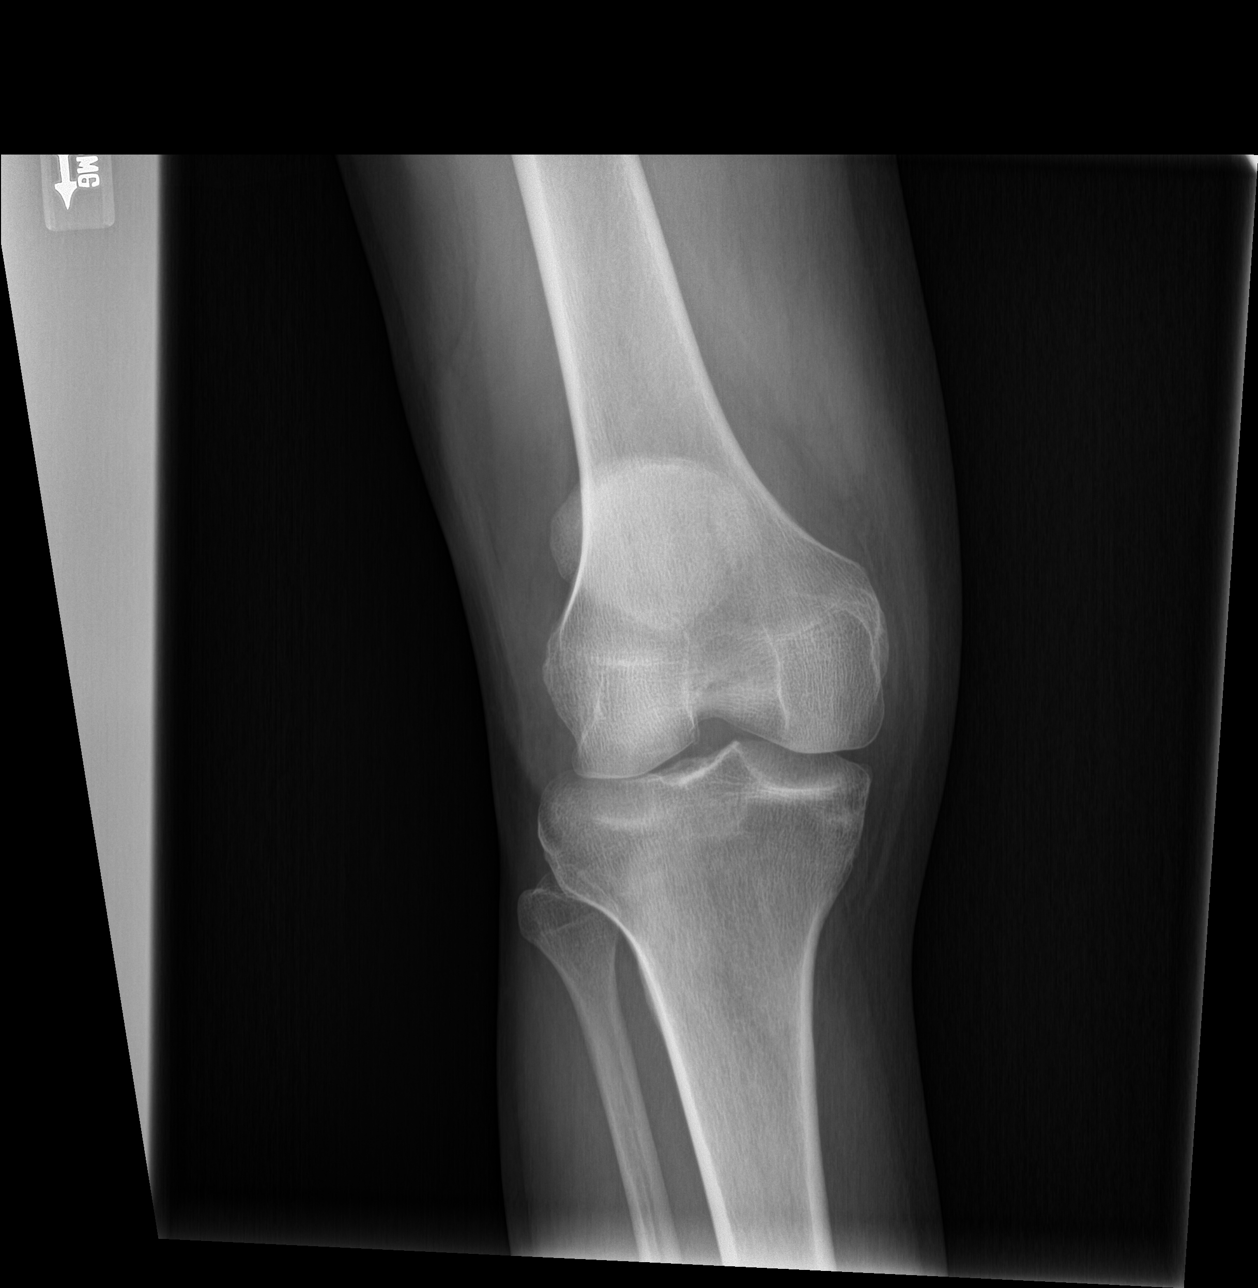

[knee lat]
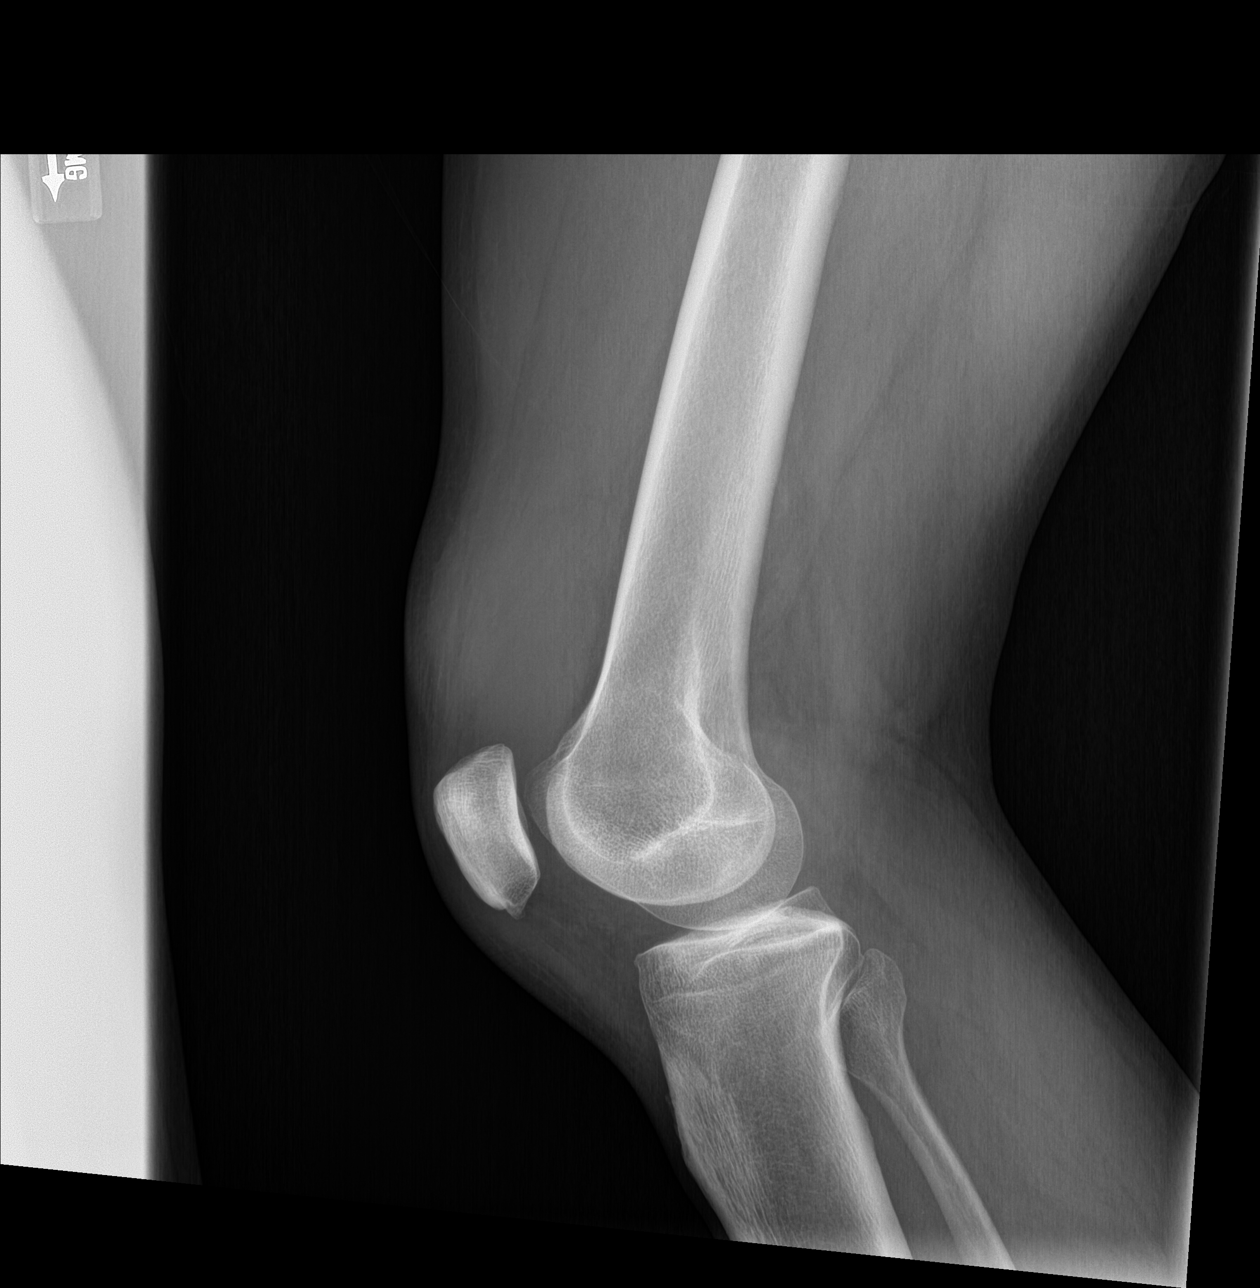

[sunrise]
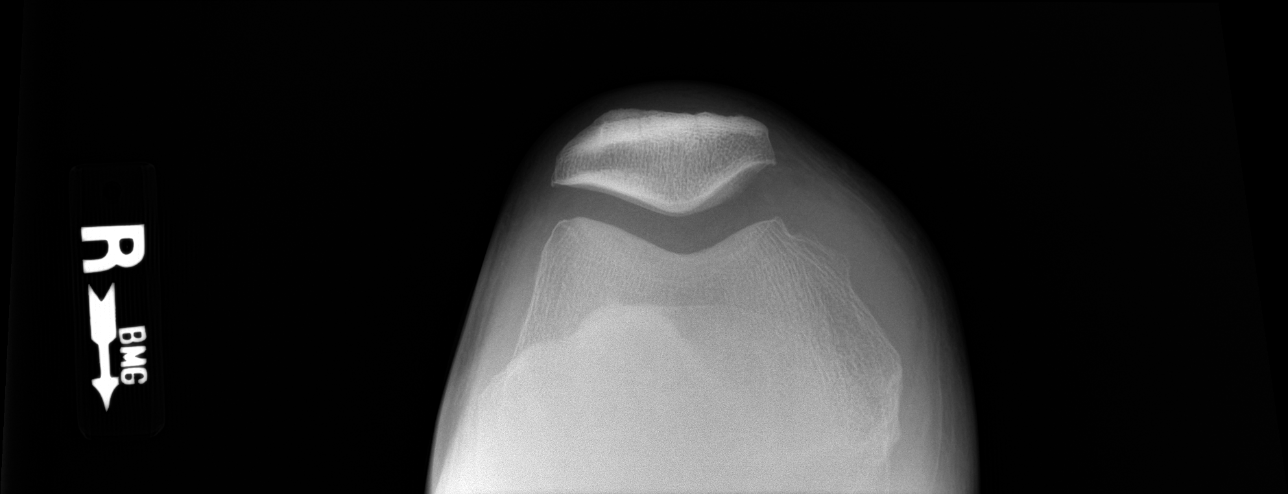

[knee [person_name]]
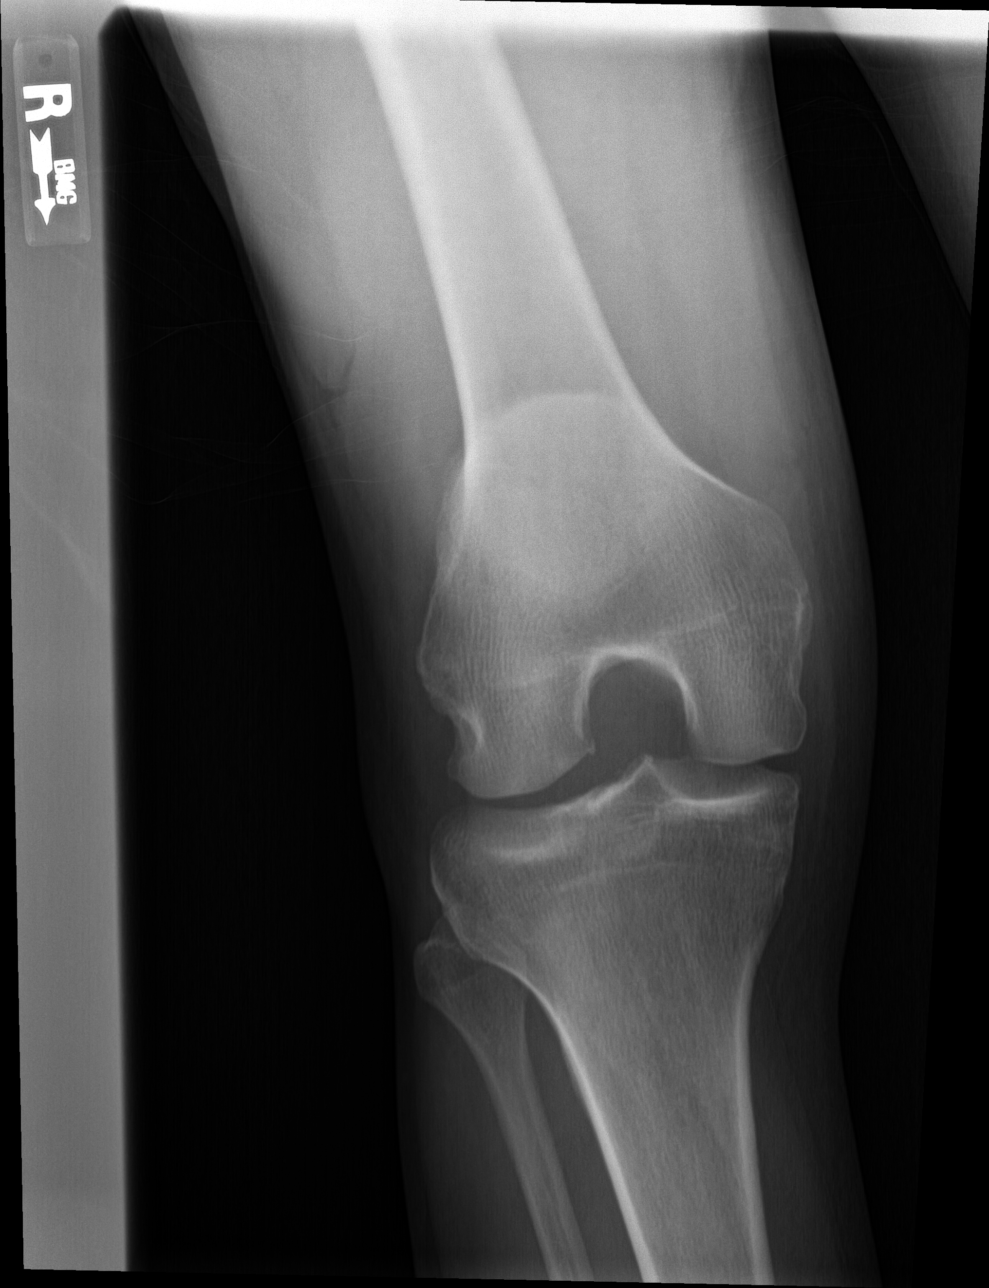

[4 of 4 positions shown; findings below may reference images not displayed]

FINDINGS: Mild degenerative changes about the RIGHT knee. Large suprapatellar
effusion and overlying soft tissue swelling. No sign of acute
fracture. No dislocation.
IMPRESSION: Large suprapatellar effusion and overlying soft tissue swelling. No
sign of acute fracture.

## 2022-04-03 ENCOUNTER — Telehealth: Payer: Self-pay | Admitting: Registered Nurse

## 2022-04-03 ENCOUNTER — Other Ambulatory Visit: Payer: Self-pay

## 2022-04-03 DIAGNOSIS — I10 Essential (primary) hypertension: Secondary | ICD-10-CM

## 2022-04-03 MED ORDER — VALSARTAN-HYDROCHLOROTHIAZIDE 320-25 MG PO TABS: 1.0000 | ORAL_TABLET | Freq: Every day | ORAL | 0 refills | Status: DC

## 2022-04-03 NOTE — Telephone Encounter (Signed)
Caller name: Theodore Rodriguez  On DPR?: Yes  Call back number: [phone] (mobile)  Provider they see: Maximiano Coss, NP  Reason for call:  pt called to [facility] [place] [facility] [place] [id number] [place] Pt wants to pick his valsartan-hydrochlorothiazide 320-25 mg.

## 2022-04-03 NOTE — Telephone Encounter (Signed)
Refill sent in and a VM was left on his spouse VM due to no VM on his number stating he will need to est care with a new provider

## 2022-04-05 ENCOUNTER — Encounter (HOSPITAL_COMMUNITY): Payer: Self-pay | Admitting: Emergency Medicine

## 2022-04-05 ENCOUNTER — Other Ambulatory Visit: Payer: Self-pay

## 2022-04-05 ENCOUNTER — Emergency Department (HOSPITAL_COMMUNITY): Payer: 59

## 2022-04-05 ENCOUNTER — Emergency Department (HOSPITAL_COMMUNITY)
Admission: EM | Admit: 2022-04-05 | Discharge: 2022-04-05 | Discharge status: Against Medical Advice | Payer: 59 | Attending: Emergency Medicine | Admitting: Emergency Medicine

## 2022-04-05 DIAGNOSIS — R079 Chest pain, unspecified: Secondary | ICD-10-CM | POA: Diagnosis not present

## 2022-04-05 DIAGNOSIS — R110 Nausea: Secondary | ICD-10-CM | POA: Diagnosis not present

## 2022-04-05 DIAGNOSIS — Z5321 Procedure and treatment not carried out due to patient leaving prior to being seen by health care provider: Secondary | ICD-10-CM | POA: Diagnosis not present

## 2022-04-05 DIAGNOSIS — R202 Paresthesia of skin: Secondary | ICD-10-CM | POA: Diagnosis not present

## 2022-04-05 DIAGNOSIS — R0789 Other chest pain: Secondary | ICD-10-CM | POA: Diagnosis not present

## 2022-04-05 LAB — CBC WITH DIFFERENTIAL/PLATELET
Abs Immature Granulocytes: 0.03 10*3/uL (ref 0.00–0.07)
Basophils Absolute: 0 10*3/uL (ref 0.0–0.1)
Basophils Relative: 1 %
Eosinophils Absolute: 0.2 10*3/uL (ref 0.0–0.5)
Eosinophils Relative: 3 %
HCT: 44.2 % (ref 39.0–52.0)
Hemoglobin: 15 g/dL (ref 13.0–17.0)
Immature Granulocytes: 1 %
Lymphocytes Relative: 56 %
Lymphs Abs: 3.5 10*3/uL (ref 0.7–4.0)
MCH: 28.8 pg (ref 26.0–34.0)
MCHC: 33.9 g/dL (ref 30.0–36.0)
MCV: 84.8 fL (ref 80.0–100.0)
Monocytes Absolute: 0.6 10*3/uL (ref 0.1–1.0)
Monocytes Relative: 9 %
Neutro Abs: 1.8 10*3/uL (ref 1.7–7.7)
Neutrophils Relative %: 30 %
Platelets: 235 10*3/uL (ref 150–400)
RBC: 5.21 MIL/uL (ref 4.22–5.81)
RDW: 14.9 % (ref 11.5–15.5)
WBC: 6.2 10*3/uL (ref 4.0–10.5)
nRBC: 0 % (ref 0.0–0.2)

## 2022-04-05 LAB — BASIC METABOLIC PANEL
Anion gap: 12 (ref 5–15)
BUN: 14 mg/dL (ref 6–20)
CO2: 28 mmol/L (ref 22–32)
Calcium: 9.5 mg/dL (ref 8.9–10.3)
Chloride: 100 mmol/L (ref 98–111)
Creatinine, Ser: 1.18 mg/dL (ref 0.61–1.24)
GFR, Estimated: >60 mL/min (ref >60)
Glucose, Bld: 142 mg/dL — ABNORMAL HIGH (ref 70–99)
Potassium: 3.9 mmol/L (ref 3.5–5.1)
Sodium: 140 mmol/L (ref 135–145)

## 2022-04-05 LAB — TROPONIN I (HIGH SENSITIVITY): Troponin I (High Sensitivity): 9 ng/L (ref <18)

## 2022-04-05 NOTE — ED Provider Triage Note (Signed)
Emergency Medicine Provider Triage Evaluation Note  Theodore Rodriguez , a 56 y.o. male  was evaluated in triage.  Pt complains of chest pain, paresthesias to bilateral upper extremities.  Denies chest pain, lightheadedness.  Did endorse nausea earlier.  Denies history of heart disease.  Review of Systems  Positive: As above Negative: As above  Physical Exam  BP (!) 155/110 (BP Location: Right Arm)   Pulse 93   Temp 97.7 F (36.5 C) (Oral)   Resp 20   SpO2 99%  Gen:   Awake, no distress   Resp:  Normal effort  MSK:   Moves extremities without difficulty  Other:    Medical Decision Making  Medically screening exam initiated at 3:56 AM.  Appropriate orders placed.  Gar[facility] Genova was informed that the remainder of the evaluation will be completed by another provider, this initial triage assessment does not replace that evaluation, and the importance of remaining in the ED until their evaluation is complete.     Evlyn Courier, PA-C 04/05/22 [phone]

## 2022-04-05 NOTE — ED Notes (Signed)
Patient called for vitals x3 

## 2022-04-05 NOTE — ED Triage Notes (Signed)
Patient with chest pain that started about one hour ago.  States that he does have some nausea, no vomiting.  Patient states that he has been on some antibiotics for sinus infection, now sinuses are draining.  Patient denies any shortness of breath at this time.

## 2022-05-29 HISTORY — PX: NO PAST SURGERIES: SHX2092

## 2022-07-01 ENCOUNTER — Other Ambulatory Visit: Payer: Self-pay | Admitting: Family Medicine

## 2022-07-01 DIAGNOSIS — I10 Essential (primary) hypertension: Secondary | ICD-10-CM

## 2022-07-03 ENCOUNTER — Other Ambulatory Visit: Payer: Self-pay

## 2022-07-03 DIAGNOSIS — I10 Essential (primary) hypertension: Secondary | ICD-10-CM

## 2022-07-03 MED ORDER — VALSARTAN-HYDROCHLOROTHIAZIDE 320-25 MG PO TABS: 1.0000 | ORAL_TABLET | Freq: Every day | ORAL | 0 refills | Status: DC

## 2022-07-04 ENCOUNTER — Other Ambulatory Visit: Payer: Self-pay | Admitting: Family Medicine

## 2022-07-04 DIAGNOSIS — I10 Essential (primary) hypertension: Secondary | ICD-10-CM

## 2022-07-14 ENCOUNTER — Encounter (HOSPITAL_COMMUNITY): Payer: Self-pay

## 2022-07-14 ENCOUNTER — Ambulatory Visit (HOSPITAL_COMMUNITY)
Admission: EM | Admit: 2022-07-14 | Discharge: 2022-07-14 | Disposition: A | Discharge status: Home / Self Care | Payer: 59 | Attending: Internal Medicine | Admitting: Internal Medicine

## 2022-07-14 DIAGNOSIS — R12 Heartburn: Secondary | ICD-10-CM

## 2022-07-14 DIAGNOSIS — I10 Essential (primary) hypertension: Secondary | ICD-10-CM | POA: Diagnosis not present

## 2022-07-14 DIAGNOSIS — K5900 Constipation, unspecified: Secondary | ICD-10-CM

## 2022-07-14 DIAGNOSIS — R0982 Postnasal drip: Secondary | ICD-10-CM | POA: Diagnosis not present

## 2022-07-14 MED ORDER — VALSARTAN-HYDROCHLOROTHIAZIDE 320-25 MG PO TABS: 1.0000 | ORAL_TABLET | Freq: Every day | ORAL | 0 refills | Status: DC

## 2022-07-14 MED ORDER — FEXOFENADINE HCL 180 MG PO TABS: 180.0000 mg | ORAL_TABLET | Freq: Every day | ORAL | 0 refills | Status: DC

## 2022-07-14 MED ORDER — FAMOTIDINE 20 MG PO TABS: 20.0000 mg | ORAL_TABLET | Freq: Two times a day (BID) | ORAL | 0 refills | Status: DC

## 2022-07-14 NOTE — Discharge Instructions (Addendum)
Drink 4-8 oz of prune juice in the morning to precent constipation Avoid eating before bed time Take the Pepsid before you eat food that trigger your heart burn

## 2022-07-14 NOTE — ED Provider Notes (Signed)
[place]    CSN: [id number] Arrival date & time: 07/14/22  1756      History   Chief Complaint Chief Complaint  Patient presents with   Medication Refill    HPI Theodore Rodriguez is a 57 y.o. male who presents requesting refill on his Diovan-HCTZ which he ran out 3 days ago. His PCP retired and he did not read the letter in time. Has new pcp appt 3/22 2- Also has been having post nasal drainage qhs since he has had a cold a few weeks ago.  3- has been having a lot of heart burn this past week, admits he has been eating chilli which was left over from work and he does eat late and go to bed after eating. Denies abdominal pain 4- his abdomen has felt bloated and has mild constipation. Denies abdo    Past Medical History:  Diagnosis Date   Diabetes mellitus without complication ([place])    Hypertension     Patient Active Problem List   Diagnosis Date Noted   Pain in right knee 11/06/2019   Essential hypertension 05/09/2017   Nonspecific abnormal electrocardiogram (ECG) (EKG) 05/09/2017   Prediabetes 05/09/2017    History reviewed. No pertinent surgical history.     Home Medications    Prior to Admission medications   Medication Sig Start Date End Date Taking? Authorizing Provider  famotidine (PEPCID) 20 MG tablet Take 1 tablet (20 mg total) by mouth 2 (two) times daily. 07/14/22  Yes Rodriguez-Southworth, Sunday Spillers, PA-C  fexofenadine (ALLEGRA ALLERGY) 180 MG tablet Take 1 tablet (180 mg total) by mouth daily. 07/14/22  Yes Rodriguez-Southworth, Sunday Spillers, PA-C  valsartan-hydrochlorothiazide (DIOVAN-HCT) 320-25 MG tablet Take 1 tablet by mouth daily. 07/14/22   Rodriguez-Southworth, Sunday Spillers, PA-C    Family History Family History  Problem Relation Age of Onset   Hypertension Mother    Colon cancer Maternal Aunt    Colon cancer Maternal Uncle    Esophageal cancer Neg Hx    Stomach cancer Neg Hx    Rectal cancer Neg Hx     Social History Social History    Tobacco Use   Smoking status: Never    Passive exposure: Never   Smokeless tobacco: Never  Vaping Use   Vaping Use: Never used  Substance Use Topics   Alcohol use: Yes    Comment: occasionally   Drug use: No     Allergies   Patient has no known allergies.   Review of Systems Review of Systems  Gastrointestinal:  Positive for abdominal distention and constipation. Negative for abdominal pain, diarrhea, nausea and vomiting.  Constitutional: Negative for diaphoresis and unexpected weight change.  HENT: Negative for tinnitus.   Eyes: Negative for visual disturbance.  Respiratory: Negative for chest tightness and shortness of breath.   Cardiovascular: Negative for chest pain, palpitations and leg swelling.  Gastrointestinal: Negative for constipation, diarrhea and nausea.  Endocrine: Negative for polydipsia, polyphagia and polyuria.  Genitourinary: Negative for dysuria and frequency.  Skin: Negative for rash and wound.  Neurological: Negative for dizziness, speech difficulty, weakness, numbness and headaches.    Physical Exam Triage Vital Signs ED Triage Vitals  Enc Vitals Group     BP 07/14/22 1829 (!) 155/105     Pulse Rate 07/14/22 1829 94     Resp 07/14/22 1829 14     Temp 07/14/22 1829 98.2 F (36.8 C)     Temp Source 07/14/22 1829 Oral     SpO2 07/14/22 1829 97 %  Weight 07/14/22 1828 165 lb (74.8 kg)     Height --      Head Circumference --      Peak Flow --      Pain Score 07/14/22 1827 0     Pain Loc --      Pain Edu? --      Excl. in [place]? --    No data found.  Updated Vital Signs BP (!) 155/105 (BP Location: Left Arm)   Pulse 94   Temp 98.2 F (36.8 C) (Oral)   Resp 14   Wt 165 lb (74.8 kg)   SpO2 97%   BMI 26.63 kg/m   Visual Acuity Right Eye Distance:   Left Eye Distance:   Bilateral Distance:    Right Eye Near:   Left Eye Near:    Bilateral Near:     Physical Exam  Constitutional: She is oriented to person, place, and time. She  appears well-developed and well-nourished. No distress.  HENT:  Head: Normocephalic and atraumatic.  Right Ear: External ear normal.  Left Ear: External ear normal.  Nose: Nose normal.  Eyes: Conjunctivae are normal. Right eye exhibits no discharge. Left eye exhibits no discharge. No scleral icterus.  Neck: Neck supple. No thyromegaly present.  No carotid bruits bilaterally  Cardiovascular: Normal rate and regular rhythm.  No murmur heard. Pulmonary/Chest: Effort normal and breath sounds normal. No respiratory distress.  Musculoskeletal: Normal range of motion. She exhibits no edema.  Lymphadenopathy:    She has no cervical adenopathy.  Neurological: She is alert and oriented to person, place, and time.  Skin: Skin is warm and dry. Capillary refill takes less than 2 seconds. No rash noted. She is not diaphoretic.  Psychiatric: She has a normal mood and affect. Her behavior is normal. Judgment and thought content normal.  Nursing note reviewed.   UC Treatments / Results  Labs (all labs ordered are listed, but only abnormal results are displayed) Labs Reviewed - No data to display  EKG   Radiology No results found.  Procedures Procedures (including critical care time)  Medications Ordered in UC Medications - No data to display  Initial Impression / Assessment and Plan / UC Course  I have reviewed the triage vital signs and the nursing notes.  Pertinent labs from 03/2022 results that were available during my care of the patient were reviewed by me and considered in my medical decision making (see chart for details).  HTN Constipation GERD Post nasal drainage  I placed him on Allegra, Pepsid, and refilled his BP med til his next appointment. See instructions.    Final Clinical Impressions(s) / UC Diagnoses   Final diagnoses:  Post-nasal drainage  Hypertension, unspecified type  Constipation, unspecified constipation type  Heart burn     Discharge Instructions       Drink 4-8 oz of prune juice in the morning to precent constipation Avoid eating before bed time Take the Pepsid before you eat food that trigger your heart burn     ED Prescriptions     Medication Sig Dispense Auth. Provider   valsartan-hydrochlorothiazide (DIOVAN-HCT) 320-25 MG tablet Take 1 tablet by mouth daily. 40 tablet Rodriguez-Southworth, Sunday Spillers, PA-C   fexofenadine (ALLEGRA ALLERGY) 180 MG tablet Take 1 tablet (180 mg total) by mouth daily. 30 tablet Rodriguez-Southworth, Sunday Spillers, PA-C   famotidine (PEPCID) 20 MG tablet Take 1 tablet (20 mg total) by mouth 2 (two) times daily. 30 tablet Rodriguez-Southworth, Sunday Spillers, [place]      PDMP  not reviewed this encounter.   Shelby Mattocks, Hershal Coria 07/14/22 2029

## 2022-07-14 NOTE — ED Triage Notes (Signed)
Pt states that he need a rx refilled. valsartan-hydrochlorothiazide (DIOVAN-HCT) 320-25 MG tablet. He's completely out. Hasn't had any in a few days Doesn't have an appointment with his primary. Primary left. He states that he didn't get the email that they had left the practice. 2 refills were given and is now out.

## 2022-08-07 ENCOUNTER — Ambulatory Visit: Payer: BC Managed Care – PPO | Admitting: Medical

## 2022-08-07 ENCOUNTER — Other Ambulatory Visit: Payer: Self-pay | Admitting: Medical

## 2022-08-07 VITALS — BP 136/80 | HR 98 | Wt 168.8 lb

## 2022-08-07 DIAGNOSIS — R42 Dizziness and giddiness: Secondary | ICD-10-CM

## 2022-08-07 DIAGNOSIS — I10 Essential (primary) hypertension: Secondary | ICD-10-CM | POA: Diagnosis not present

## 2022-08-07 DIAGNOSIS — N529 Male erectile dysfunction, unspecified: Secondary | ICD-10-CM

## 2022-08-07 MED ORDER — VALSARTAN-HYDROCHLOROTHIAZIDE 320-25 MG PO TABS: 1.0000 | ORAL_TABLET | Freq: Every day | ORAL | 1 refills | Status: DC

## 2022-08-07 MED ORDER — TADALAFIL 20 MG PO TABS: 10.0000 mg | ORAL_TABLET | ORAL | 1 refills | Status: DC | PRN

## 2022-08-07 NOTE — Progress Notes (Signed)
Subjective:  Theodore Rodriguez is a 57 y.o. male who presents for Chief Complaint  Patient presents with   new pt get establish    New pt get established. Had some dizzy spells that came and go about 2 weeks ago. Has gone better since then     Here for new patient.  Was seeing doctor in [place], [place] prior  HTN - Needs refill for blood pressure medications.  Been on valsartan HCT a while.    He notes for past few weeks having rare occasional dizziness.  Not like room spinning.    No palpitations, no chest pain, on edema.  Sometimes feels lightheaded along with gut upset, nausea.  No off balance.  Dizziness last a few seconds.   No hearing loss or ringing in ear.  No palpitations or chest pain.     Water intake is not the best.  Drinks several alcohol drinks per week.  [place] [facility].   Not exercising a lot  Has trouble getting and keeping erections.  No prior use of viagra or similar .  Would like to try this.   No other aggravating or relieving factors.    No other c/o.  Past Medical History:  Diagnosis Date   Diabetes mellitus without complication ([place])    Hypertension    Current Outpatient Medications on File Prior to Visit  Medication Sig Dispense Refill   famotidine (PEPCID) 20 MG tablet Take 1 tablet (20 mg total) by mouth 2 (two) times daily. 30 tablet 0   fexofenadine (ALLEGRA ALLERGY) 180 MG tablet Take 1 tablet (180 mg total) by mouth daily. 30 tablet 0   No current facility-administered medications on file prior to visit.    The following portions of the patient's history were reviewed and updated as appropriate: allergies, current medications, past family history, past medical history, past social history, past surgical history and problem list.  ROS Otherwise as in subjective above    Objective: BP 136/80   Pulse 98   Wt 168 lb 12.8 oz (76.6 kg)   SpO2 99%   BMI 27.25 kg/m   BP Readings from Last 3 Encounters:  08/07/22 136/80  07/14/22  (!) 155/105  04/05/22 (!) 155/110   Wt Readings from Last 3 Encounters:  08/07/22 168 lb 12.8 oz (76.6 kg)  07/14/22 165 lb (74.8 kg)  07/28/21 155 lb 12.8 oz (70.7 kg)   General appearance: alert, no distress, well developed, well nourished, African American male Neck: supple, no lymphadenopathy, no thyromegaly, no masses Heart: RRR, normal S1, S2, no murmurs Lungs: CTA bilaterally, no wheezes, rhonchi, or rales Pulses: 2+ radial pulses, 2+ pedal pulses, normal cap refill Ext: no edema   Assessment: Encounter Diagnoses  Name Primary?   Essential hypertension Yes   Erectile dysfunction, unspecified erectile dysfunction type    Dizziness      Plan: HTN - continue current medication  Dizziness - I reviewed EKG and labs in chart record from 03/2022.  Dizziness has been brief.  Advised he continue current BP medication, needs to work on better water hydration.  Advised less alcohol.   If new or worse symptoms in the near future, then recheck.  Erectile Dysfunction - Reviewed pathophysiology and differential diagnosis of erectile dysfunction with the patient.  Discussed treatment options.  Begin trial of Cialis.  Discussed potential risks of medications including hypotension and priapism.  Discussed proper use of medication.  Questions were answered.  Recheck next visit  Theodore Rodriguez was  seen today for new pt get establish.  Diagnoses and all orders for this visit:  Essential hypertension -     valsartan-hydrochlorothiazide (DIOVAN-HCT) 320-25 MG tablet; Take 1 tablet by mouth daily.  Erectile dysfunction, unspecified erectile dysfunction type  Dizziness  Other orders -     tadalafil (CIALIS) 20 MG tablet; Take 0.5-1 tablets (10-20 mg total) by mouth every other day as needed for erectile dysfunction.    Follow up: 57[facility] well visit

## 2022-08-08 ENCOUNTER — Other Ambulatory Visit: Payer: Self-pay | Admitting: Medical

## 2022-08-08 MED ORDER — SILDENAFIL CITRATE 100 MG PO TABS: 50.0000 mg | ORAL_TABLET | ORAL | 1 refills | Status: DC | PRN

## 2022-08-08 NOTE — Telephone Encounter (Signed)
Patient will call his insurance and then call back once he finds out which is covered.

## 2022-10-15 DIAGNOSIS — R600 Localized edema: Secondary | ICD-10-CM | POA: Diagnosis not present

## 2022-10-15 DIAGNOSIS — Z8249 Family history of ischemic heart disease and other diseases of the circulatory system: Secondary | ICD-10-CM | POA: Diagnosis not present

## 2022-10-15 DIAGNOSIS — I10 Essential (primary) hypertension: Secondary | ICD-10-CM | POA: Diagnosis not present

## 2022-10-15 DIAGNOSIS — Z803 Family history of malignant neoplasm of breast: Secondary | ICD-10-CM | POA: Diagnosis not present

## 2022-10-27 DIAGNOSIS — R051 Acute cough: Secondary | ICD-10-CM | POA: Diagnosis not present

## 2022-10-27 DIAGNOSIS — J029 Acute pharyngitis, unspecified: Secondary | ICD-10-CM | POA: Diagnosis not present

## 2022-10-30 ENCOUNTER — Encounter: Payer: Self-pay | Admitting: Medical

## 2022-10-30 ENCOUNTER — Ambulatory Visit: Payer: BC Managed Care – PPO | Admitting: Medical

## 2022-10-30 VITALS — BP 132/80 | HR 110 | Temp 98.6°F | Wt 168.6 lb

## 2022-10-30 DIAGNOSIS — R110 Nausea: Secondary | ICD-10-CM | POA: Diagnosis not present

## 2022-10-30 DIAGNOSIS — J3489 Other specified disorders of nose and nasal sinuses: Secondary | ICD-10-CM

## 2022-10-30 DIAGNOSIS — R5383 Other fatigue: Secondary | ICD-10-CM | POA: Diagnosis not present

## 2022-10-30 DIAGNOSIS — R058 Other specified cough: Secondary | ICD-10-CM

## 2022-10-30 DIAGNOSIS — N529 Male erectile dysfunction, unspecified: Secondary | ICD-10-CM

## 2022-10-30 MED ORDER — AMOXICILLIN 875 MG PO TABS: 875.0000 mg | ORAL_TABLET | Freq: Two times a day (BID) | ORAL | 0 refills | Status: AC

## 2022-10-30 NOTE — Progress Notes (Signed)
Subjective:  Theodore Rodriguez is a 57 y.o. male who presents for Chief Complaint  Patient presents with   other    Cough, URI when he went to [facility] started, coughing up yellow mucous, eye were red and matted together sinus drainage, no fever,      Here for about a week of respiratory infection.  He notes cough, productive mucous colored mucous, sinus pressure, headache, eyes crusting in the morning some, fatigue.   Some sore throat.  Has had some nausea from the drainage.  No vomiting.  No loose stool.  No SOB or wheezing.  Has had some sick contacts.  Went to urgent care last week for same, 3 days ago.  Was given inhaler, and 2 different cough medicaiton.  Not having to use inhaler as much as no SOB.  Doesn't feel much improved.  Has increased water intake.    Not sure if insurance covered Viagra or cilias.  No other aggravating or relieving factors.    No other c/o.  Past Medical History:  Diagnosis Date   Diabetes mellitus without complication ([facility])    Hypertension    Current Outpatient Medications on File Prior to Visit  Medication Sig Dispense Refill   albuterol (VENTOLIN HFA) 108 (90 Base) MCG/ACT inhaler Inhale into the lungs.     azelastine (ASTELIN) 0.1 % nasal spray one spray by Both Nostrils route 2 (two) times daily. Use in each nostril as directed     benzonatate (TESSALON) 200 MG capsule Take by mouth.     promethazine-dextromethorphan (PROMETHAZINE-DM) 6.25-15 MG/5ML syrup Take by mouth.     sildenafil (VIAGRA) 100 MG tablet Take 0.5-1 tablets (50-100 mg total) by mouth as needed for erectile dysfunction. (Patient not taking: Reported on 10/30/2022) 20 tablet 1   valsartan-hydrochlorothiazide (DIOVAN-HCT) 320-25 MG tablet Take 1 tablet by mouth daily. 90 tablet 1   famotidine (PEPCID) 20 MG tablet Take 1 tablet (20 mg total) by mouth 2 (two) times daily. (Patient not taking: Reported on 10/30/2022) 30 tablet 0   fexofenadine (ALLEGRA ALLERGY) 180 MG tablet Take 1  tablet (180 mg total) by mouth daily. (Patient not taking: Reported on 10/30/2022) 30 tablet 0   tadalafil (CIALIS) 20 MG tablet Take 0.5-1 tablets (10-20 mg total) by mouth every other day as needed for erectile dysfunction. (Patient not taking: Reported on 10/30/2022) 10 tablet 1   No current facility-administered medications on file prior to visit.     The following portions of the patient's history were reviewed and updated as appropriate: allergies, current medications, past family history, past medical history, past social history, past surgical history and problem list.  ROS Otherwise as in subjective above    Objective: BP 132/80   Pulse (!) 110   Temp 98.6 F (37 C)   Wt 168 lb 9.6 oz (76.5 kg)   BMI 27.21 kg/m   BP Readings from Last 3 Encounters:  10/30/22 132/80  08/07/22 136/80  07/14/22 (!) 155/105   Wt Readings from Last 3 Encounters:  10/30/22 168 lb 9.6 oz (76.5 kg)  08/07/22 168 lb 12.8 oz (76.6 kg)  07/14/22 165 lb (74.8 kg)   General appearance: alert, no distress, well developed, well nourished HEENT: normocephalic, sclerae anicteric, conjunctiva mildly injected, somewhat dry,  TMs pearly, nares patent, no discharge or erythema, pharynx normal Oral cavity: MMM, no lesions Neck: supple, shoddy tender anterior nodes, otherwise no thyromegaly, no masses Heart: RRR, normal S1, S2, no murmurs Lungs: CTA bilaterally, no  wheezes, rhonchi, or rales Abdomen: +bs, soft, non tender, non distended, no masses, no hepatomegaly, no splenomegaly Pulses: 2+ radial pulses, 2+ pedal pulses, normal cap refill Ext: no edema   Assessment: Encounter Diagnoses  Name Primary?   Cough productive of purulent sputum Yes   Sinus pressure    Other fatigue    Nausea    Erectile dysfunction, unspecified erectile dysfunction type      Plan: I reviewed his recent urgent care notes.  At this point he will continue to use Promethazine DM and Tessalon Perles as needed for cough,  hydrate well, increase water intake exercise, rest, begin antibiotic as below.  If not much improved in the next 3 to 5 days let me know.  ED - I will have him check insurance on coverage for Cialis and Viagra  Theodore Rodriguez was seen today for other.  Diagnoses and all orders for this visit:  Cough productive of purulent sputum  Sinus pressure  Other fatigue  Nausea  Erectile dysfunction, unspecified erectile dysfunction type  Other orders -     amoxicillin (AMOXIL) 875 MG tablet; Take 1 tablet (875 mg total) by mouth 2 (two) times daily for 10 days.    Follow up: prn

## 2022-10-30 NOTE — Patient Instructions (Signed)
Please call your insurance company to see which medication they prefer, Cialis vs Viagra.  Let me know and I'll send to pharmacy   There is also Sildenafil generic OTC you can get possibly cheaper without a prescription  You may want to price Cialis 36m, #30 tablets through the following mail order pharmacy   [facility]  Mail order pharmacy  [email or web address]

## 2022-11-21 ENCOUNTER — Encounter: Payer: Self-pay | Admitting: Medical

## 2022-11-21 ENCOUNTER — Ambulatory Visit: Payer: BC Managed Care – PPO | Admitting: Medical

## 2022-11-21 VITALS — BP 128/76 | HR 98 | Ht 65.5 in | Wt 168.4 lb

## 2022-11-21 DIAGNOSIS — I10 Essential (primary) hypertension: Secondary | ICD-10-CM

## 2022-11-21 DIAGNOSIS — R7303 Prediabetes: Secondary | ICD-10-CM

## 2022-11-21 DIAGNOSIS — Z0000 Encounter for general adult medical examination without abnormal findings: Secondary | ICD-10-CM

## 2022-11-21 DIAGNOSIS — Z125 Encounter for screening for malignant neoplasm of prostate: Secondary | ICD-10-CM | POA: Diagnosis not present

## 2022-11-21 DIAGNOSIS — R000 Tachycardia, unspecified: Secondary | ICD-10-CM

## 2022-11-21 DIAGNOSIS — Z23 Encounter for immunization: Secondary | ICD-10-CM | POA: Diagnosis not present

## 2022-11-21 DIAGNOSIS — Z1159 Encounter for screening for other viral diseases: Secondary | ICD-10-CM

## 2022-11-21 DIAGNOSIS — Z13220 Encounter for screening for lipoid disorders: Secondary | ICD-10-CM | POA: Diagnosis not present

## 2022-11-21 NOTE — Progress Notes (Signed)
Subjective:   HPI  Theodore Rodriguez is a 57 y.o. male who presents for Chief Complaint  Patient presents with   Annual Exam    Fasting (blood drawn this am) annual exam. Could not give urine but will try on way out.     Patient Care Team: Isola Mehlman, Cleda Mccreedy as PCP - General (Family Medicine) Dr. Tressia Danas, GI  Concerns: No c/o.   Reviewed their medical, surgical, family, social, medication, and allergy history and updated chart as appropriate.  No Known Allergies  Past Medical History:  Diagnosis Date   Diabetes mellitus without complication (HCC)    Hypertension     Current Outpatient Medications on File Prior to Visit  Medication Sig Dispense Refill   valsartan-hydrochlorothiazide (DIOVAN-HCT) 320-25 MG tablet Take 1 tablet by mouth daily. 90 tablet 1   No current facility-administered medications on file prior to visit.      Current Outpatient Medications:    valsartan-hydrochlorothiazide (DIOVAN-HCT) 320-25 MG tablet, Take 1 tablet by mouth daily., Disp: 90 tablet, Rfl: 1  Family History  Problem Relation Age of Onset   Hypertension Mother    Kidney disease Father    Cancer Father        liver   Heart disease Sister        congenital   Diabetes Maternal Aunt    Colon cancer Maternal Aunt    Colon cancer Maternal Uncle    Esophageal cancer Neg Hx    Stomach cancer Neg Hx    Rectal cancer Neg Hx     Past Surgical History:  Procedure Laterality Date   COLONOSCOPY  04/2018   multiple polyps, repeat 2024; Dr. Tressia Danas   NO PAST SURGERIES  2024    Review of Systems  Constitutional:  Negative for chills, fever, malaise/fatigue and weight loss.  HENT:  Negative for congestion, ear pain, hearing loss, sore throat and tinnitus.   Eyes:  Negative for blurred vision, pain and redness.  Respiratory:  Negative for cough, hemoptysis and shortness of breath.   Cardiovascular:  Negative for chest pain, palpitations, orthopnea, claudication and  leg swelling.  Gastrointestinal:  Negative for abdominal pain, blood in stool, constipation, diarrhea, nausea and vomiting.  Genitourinary:  Negative for dysuria, flank pain, frequency, hematuria and urgency.  Musculoskeletal:  Negative for falls, joint pain and myalgias.  Skin:  Negative for itching and rash.  Neurological:  Negative for dizziness, tingling, speech change, weakness and headaches.  Endo/Heme/Allergies:  Negative for polydipsia. Does not bruise/bleed easily.  Psychiatric/Behavioral:  Negative for depression and memory loss. The patient is not nervous/anxious and does not have insomnia.       Objective:  BP 128/76   Pulse 98   Ht 5' 5.5" (1.664 m)   Wt 168 lb 6.4 oz (76.4 kg)   SpO2 97%   BMI 27.60 kg/m   General appearance: alert, no distress, WD/WN, African American male Skin: unremarkable HEENT: normocephalic, conjunctiva/corneas normal, sclerae anicteric, PERRLA, EOMi, nares patent, no discharge or erythema, pharynx normal Oral cavity: MMM, tongue normal, teeth normal Neck: supple, no lymphadenopathy, no thyromegaly, no masses, normal ROM, no bruits Chest: non tender, normal shape and expansion Heart: RRR, normal S1, S2, no murmurs Lungs: CTA bilaterally, no wheezes, rhonchi, or rales Abdomen: +bs, soft, non tender, non distended, no masses, no hepatomegaly, no splenomegaly, no bruits Back: non tender, normal ROM, no scoliosis Musculoskeletal: upper extremities non tender, no obvious deformity, normal ROM throughout, lower extremities non tender, no obvious deformity,  normal ROM throughout Extremities: no edema, no cyanosis, no clubbing Pulses: 2+ symmetric, upper and lower extremities, normal cap refill Neurological: alert, oriented x 3, CN2-12 intact, strength normal upper extremities and lower extremities, sensation normal throughout, DTRs 2+ throughout, no cerebellar signs, gait normal Psychiatric: normal affect, behavior normal, pleasant  GU: normal male  external genitalia,circumcised, nontender, no masses, no hernia, no lymphadenopathy Rectal: declined    Assessment and Plan :   Encounter Diagnoses  Name Primary?   Annual physical exam Yes   Prediabetes    Essential hypertension    Need for hepatitis C screening test    Lipid screening    Need for shingles vaccine    Screening for prostate cancer    Tachycardia     This visit was a preventative care visit, also known as wellness visit or routine physical.   Topics typically include healthy lifestyle, diet, exercise, preventative care, vaccinations, sick and well care, proper use of emergency dept and after hours care, as well as other concerns.     Separate significant issues discussed: Elevated pulse -   Prediabetes - advise lifestyle changes  HTN - consider beta blocker given tachycardia on exam.  I reviewed prior EKG from 2023.   General Recommendations: Continue to return yearly for your annual wellness and preventative care visits.  This gives [place] a chance to discuss healthy lifestyle, exercise, vaccinations, review your chart record, and perform screenings where appropriate.  I recommend you see your eye doctor yearly for routine vision care.  I recommend you see your dentist yearly for routine dental care including hygiene visits twice yearly.   Vaccination  Immunization History  Administered Date(s) Administered   Influenza Split 03/12/2013   Influenza,inj,Quad PF,6+ Mos 05/09/2017, 04/16/2018, 01/28/2021   Tdap 01/17/2015   Zoster Recombinat (Shingrix) 11/21/2022    Vaccine recommendations: shingrix  Vaccines administered today: Counseled on the Shingrix vaccine.  Vaccine information sheet given. Shingrix #1 vaccine given after consent obtained.   Return in 2 months for Shingrix #2.   Screening for cancer: Colon cancer screening: Due 04/2023  Prior or last colon cancer screen: 2019  Testicular cancer screening You should do a monthly self  testicular exam if you are between 23-46 years old, and we typically do a testicular exam on the yearly physical for this same age group.   Prostate Cancer screening: The recommended prostate cancer screening test is a blood test called the prostate-specific antigen (PSA) test. PSA is a protein that is made in the prostate. As you age, your prostate naturally produces more PSA. Abnormally high PSA levels may be caused by: Prostate cancer. An enlarged prostate that is not caused by cancer (benign prostatic hyperplasia, or BPH). This condition is very common in older men. A prostate gland infection (prostatitis) or urinary tract infection. Certain medicines such as male hormones (like testosterone) or other medicines that raise testosterone levels. A rectal exam may be done as part of prostate cancer screening to help provide information about the size of your prostate gland. When a rectal exam is performed, it should be done after the PSA level is drawn to avoid any effect on the results.   Skin cancer screening: Check your skin regularly for new changes, growing lesions, or other lesions of concern Come in for evaluation if you have skin lesions of concern.   Lung cancer screening: If you have a greater than 20 pack year history of tobacco use, then you may qualify for lung cancer screening with  a chest CT scan.   Please call your insurance company to inquire about coverage for this test.   Pancreatic cancer:  no current screening test is available or routinely recommended. (risk factors: smoking, overweight or obese, diabetes, chronic pancreatitis, work exposure - dry cleaning, metal working, 58yo>, M>F, Tree surgeon, family hx/o, hereditary breast, ovarian, melanoma, lynch, peutz-jeghers).  Symptoms: jaundice, dark urine, light color or greasy stools, itchy skin, belly or back pain, weight loss, poor appetite, nausea, vomiting, liver enlargement, DVT/blood clots.   We currently don't  have screenings for other cancers besides breast, cervical, colon, and lung cancers.  If you have a strong family history of cancer or have other cancer screening concerns, please let me know.  Genetic testing referral is an option for individuals with high cancer risk in the family.  There are some other cancer screenings in development currently.   Bone health: Get at least 150 minutes of aerobic exercise weekly Get weight bearing exercise at least once weekly Bone density test:  A bone density test is an imaging test that uses a type of X-ray to measure the amount of calcium and other minerals in your bones. The test may be used to diagnose or screen you for a condition that causes weak or thin bones (osteoporosis), predict your risk for a broken bone (fracture), or determine how well your osteoporosis treatment is working. The bone density test is recommended for females 65 and older, or females or males <65 if certain risk factors such as thyroid disease, long term use of steroids such as for asthma or rheumatological issues, vitamin D deficiency, estrogen deficiency, family history of osteoporosis, self or family history of fragility fracture in first degree relative.    Heart health: Get at least 150 minutes of aerobic exercise weekly Limit alcohol It is important to maintain a healthy blood pressure and healthy cholesterol numbers  Heart disease screening: Screening for heart disease includes screening for blood pressure, fasting lipids, glucose/diabetes screening, BMI height to weight ratio, reviewed of smoking status, physical activity, and diet.    Goals include blood pressure 120/80 or less, maintaining a healthy lipid/cholesterol profile, preventing diabetes or keeping diabetes numbers under good control, not smoking or using tobacco products, exercising most days per week or at least 150 minutes per week of exercise, and eating healthy variety of fruits and vegetables, healthy  oils, and avoiding unhealthy food choices like fried food, fast food, high sugar and high cholesterol foods.    Other tests may possibly include EKG test, CT coronary calcium score, echocardiogram, exercise treadmill stress test.      Vascular disease screening: For higher risk individuals including smokers, diabetics, patients with known heart disease or high blood pressure, kidney disease, and others, screening for vascular disease or atherosclerosis of the arteries is available.  Examples may include carotid ultrasound, abdominal aortic ultrasound, ABI blood flow screening in the legs, thoracic aorta screening.    Medical care options: I recommend you continue to seek care here first for routine care.  We try really hard to have available appointments Monday through Friday daytime hours for sick visits, acute visits, and physicals.  Urgent care should be used for after hours and weekends for significant issues that cannot wait till the next day.  The emergency department should be used for significant potentially life-threatening emergencies.  The emergency department is expensive, can often have long wait times for less significant concerns, so try to utilize primary care, urgent care, or telemedicine when  possible to avoid unnecessary trips to the emergency department.  Virtual visits and telemedicine have been introduced since the pandemic started in 2020, and can be convenient ways to receive medical care.  We offer virtual appointments as well to assist you in a variety of options to seek medical care.   Legal  Take the time to do a last will and testament, Advanced Directives including Health Care Power of Attorney and Living Will documents.  Don't leave your family with burdens that can be handled ahead of time.   Advanced Directives: I recommend you consider completing a Health Care Power of Attorney and Living Will.   These documents respect your wishes and help alleviate burdens on  your loved ones if you were to become terminally ill or be in a position to need those documents enforced.    You can complete Advanced Directives yourself, have them notarized, then have copies made for our office, for you and for anybody you feel should have them in safe keeping.  Or, you can have an attorney prepare these documents.   If you haven't updated your Last Will and Testament in a while, it may be worthwhile having an attorney prepare these documents together and save on some costs.       Spiritual and Emotional Health Keeping a healthy spiritual life can help you better manage your physical health. Your spiritual life can help you to cope with any issues that may arise with your physical health.  Balance can keep [place] healthy and help [place] to recover.  If you are struggling with your spiritual health there are questions that you may want to ask yourself:  What makes me feel most complete? When do I feel most connected to the rest of the world? Where do I find the most inner strength? What am I doing when I feel whole?  Helpful tips: Being in nature. Some people feel very connected and at peace when they are walking outdoors or are outside. Helping others. Some feel the largest sense of wellbeing when they are of service to others. Being of service can take on many forms. It can be doing volunteer work, being kind to strangers, or offering a hand to a friend in need. Gratitude. Some people find they feel the most connected when they remain grateful. They may make lists of all the things they are grateful for or say a thank you out loud for all they have.    Emotional Health Are you in tune with your emotional health?  Check out this link: http://www.marquez-love.com/    Financial Health Make sure you use a budget for your personal finances Make sure you are insured against risks (health insurance, life insurance, auto insurance, etc) Save more, spend less Set  financial goals If you need help in this area, good resources include counseling through [employer] or other community resources, have a meeting with a Social research officer, government, and a good resource is the [employer]    Meril was seen today for annual exam.  Diagnoses and all orders for this visit:  Annual physical exam -     CBC with Differential/Platelet -     Cancel: HCV Ab w Reflex to Quant PCR -     PSA -     TSH -     Hepatitis C antibody  Prediabetes -     Hemoglobin A1c  Essential hypertension -     CMP14+EGFR  Need for hepatitis C screening test -  Cancel: HCV Ab w Reflex to Quant PCR -     Hepatitis C antibody  Lipid screening -     Lipid panel  Need for shingles vaccine -     Zoster Recombinant (Shingrix )  Screening for prostate cancer -     PSA  Tachycardia -     TSH     Follow-up pending labs, yearly for physical

## 2022-11-22 ENCOUNTER — Other Ambulatory Visit: Payer: Self-pay | Admitting: Medical

## 2022-11-22 LAB — CMP14+EGFR
ALT: 36 IU/L (ref 0–44)
AST: 33 IU/L (ref 0–40)
Albumin: 4 g/dL (ref 3.8–4.9)
Alkaline Phosphatase: 86 IU/L (ref 44–121)
BUN/Creatinine Ratio: 10 (ref 9–20)
BUN: 10 mg/dL (ref 6–24)
Bilirubin Total: 0.6 mg/dL (ref 0.0–1.2)
CO2: 25 mmol/L (ref 20–29)
Calcium: 9 mg/dL (ref 8.7–10.2)
Chloride: 100 mmol/L (ref 96–106)
Creatinine, Ser: 1.05 mg/dL (ref 0.76–1.27)
Globulin, Total: 2.7 g/dL (ref 1.5–4.5)
Glucose: 103 mg/dL — ABNORMAL HIGH (ref 70–99)
Potassium: 4.3 mmol/L (ref 3.5–5.2)
Sodium: 138 mmol/L (ref 134–144)
Total Protein: 6.7 g/dL (ref 6.0–8.5)
eGFR: 82 mL/min/{1.73_m2} (ref >59)

## 2022-11-22 LAB — CBC WITH DIFFERENTIAL/PLATELET
Basophils Absolute: 0 10*3/uL (ref 0.0–0.2)
Basos: 1 %
EOS (ABSOLUTE): 0.1 10*3/uL (ref 0.0–0.4)
Eos: 3 %
Hematocrit: 44.6 % (ref 37.5–51.0)
Hemoglobin: 14.7 g/dL (ref 13.0–17.7)
Immature Grans (Abs): 0 10*3/uL (ref 0.0–0.1)
Immature Granulocytes: 1 %
Lymphocytes Absolute: 1.9 10*3/uL (ref 0.7–3.1)
Lymphs: 43 %
MCH: 27.8 pg (ref 26.6–33.0)
MCHC: 33 g/dL (ref 31.5–35.7)
MCV: 85 fL (ref 79–97)
Monocytes Absolute: 0.5 10*3/uL (ref 0.1–0.9)
Monocytes: 11 %
Neutrophils Absolute: 1.7 10*3/uL (ref 1.4–7.0)
Neutrophils: 41 %
Platelets: 270 10*3/uL (ref 150–450)
RBC: 5.28 x10E6/uL (ref 4.14–5.80)
RDW: 15.2 % (ref 11.6–15.4)
WBC: 4.3 10*3/uL (ref 3.4–10.8)

## 2022-11-22 LAB — LIPID PANEL
Chol/HDL Ratio: 3 ratio (ref 0.0–5.0)
Cholesterol, Total: 178 mg/dL (ref 100–199)
HDL: 60 mg/dL (ref >39)
LDL Chol Calc (NIH): 103 mg/dL — ABNORMAL HIGH (ref 0–99)
Triglycerides: 81 mg/dL (ref 0–149)
VLDL Cholesterol Cal: 15 mg/dL (ref 5–40)

## 2022-11-22 LAB — HEPATITIS C ANTIBODY: Hep C Virus Ab: NONREACTIVE

## 2022-11-22 LAB — PSA: Prostate Specific Ag, Serum: 2.8 ng/mL (ref 0.0–4.0)

## 2022-11-22 LAB — HEMOGLOBIN A1C
Est. average glucose Bld gHb Est-mCnc: 146 mg/dL
Hgb A1c MFr Bld: 6.7 % — ABNORMAL HIGH (ref 4.8–5.6)

## 2022-11-22 LAB — TSH: TSH: 0.773 u[IU]/mL (ref 0.450–4.500)

## 2022-11-22 NOTE — Progress Notes (Signed)
Results sent through MyChart

## 2022-12-17 DIAGNOSIS — R059 Cough, unspecified: Secondary | ICD-10-CM | POA: Diagnosis not present

## 2022-12-17 DIAGNOSIS — Z79899 Other long term (current) drug therapy: Secondary | ICD-10-CM | POA: Diagnosis not present

## 2022-12-17 DIAGNOSIS — U071 COVID-19: Secondary | ICD-10-CM | POA: Diagnosis not present

## 2022-12-17 DIAGNOSIS — R6883 Chills (without fever): Secondary | ICD-10-CM | POA: Diagnosis not present

## 2022-12-17 DIAGNOSIS — I10 Essential (primary) hypertension: Secondary | ICD-10-CM | POA: Diagnosis not present

## 2023-02-02 ENCOUNTER — Other Ambulatory Visit: Payer: Self-pay | Admitting: Medical

## 2023-02-02 DIAGNOSIS — I10 Essential (primary) hypertension: Secondary | ICD-10-CM

## 2023-02-19 DIAGNOSIS — H40033 Anatomical narrow angle, bilateral: Secondary | ICD-10-CM | POA: Diagnosis not present

## 2023-02-19 DIAGNOSIS — H2513 Age-related nuclear cataract, bilateral: Secondary | ICD-10-CM | POA: Diagnosis not present

## 2023-04-02 ENCOUNTER — Ambulatory Visit: Payer: BC Managed Care – PPO | Admitting: Medical

## 2023-04-02 VITALS — BP 120/82 | HR 86 | Wt 167.8 lb

## 2023-04-02 DIAGNOSIS — M25562 Pain in left knee: Secondary | ICD-10-CM | POA: Diagnosis not present

## 2023-04-02 NOTE — Progress Notes (Signed)
Subjective:  Theodore Rodriguez is a 57 y.o. male who presents for Chief Complaint  Patient presents with   Knee Pain    Knee pain x last Tuesday, having trouble bending, Friday started just with Soreness     Here for left knee pain.  A week ago had stiffness, pain of left knee.  Been using heat, cold, biofreeze, elevation, but nothing helping.   Had sharp pains in the knee.  However, this past Friday 3 days ago started having some improvement.  No fall, no trauma, no injury.  Denies chronic knee pain.  No pain in ankle or hip.  Used some oral tylenol without much improvement.  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   Exercise - on the job.  No other aggravating or relieving factors.    No other c/o.  Past Medical History:  Diagnosis Date   Diabetes mellitus without complication (HCC)    Hypertension    Current Outpatient Medications on File Prior to Visit  Medication Sig Dispense Refill   valsartan-hydrochlorothiazide (DIOVAN-HCT) 320-25 MG tablet TAKE 1 TABLET BY MOUTH EVERY DAY 30 tablet 5   No current facility-administered medications on file prior to visit.     The following portions of the patient's history were reviewed and updated as appropriate: allergies, current medications, past family history, past medical history, past social history, past surgical history and problem list.  ROS Otherwise as in subjective above  Objective: BP 120/82   Pulse 86   Wt 167 lb 12.8 oz (76.1 kg)   BMI 27.50 kg/m   General appearance: alert, no distress, well developed, well nourished MSK: tender over left patella, but no swelling, no laxity, no deformity, ROM normal, negative Mcmurray, rest of legs unremarkable Legs neurovascularly intact    Assessment: Encounter Diagnosis  Name Primary?   Acute pain of left knee Yes     Plan: Discussed findings.  Compared to last week much improved.  Discussed the following recommendations  Patient  Instructions  Your knee pain and symptoms suggests inflammation  Recommendations Use cold therapy such as ice water 20 minutes 2-3 times daily the next few days Begin over the counter Aleve or Ibuprofen twice daily for the next 4-6 days for pain and inflammation You can alternate with tylenol for pain if desired Use relative rest this week Use stretching daily and the leg press exercise I showed you If worse again or causing ongoing problems over the next month, then call or recheck     [name] was seen today for knee pain.  Diagnoses and all orders for this visit:  Acute pain of left knee    Follow up: prn

## 2023-04-02 NOTE — Patient Instructions (Signed)
Your knee pain and symptoms suggests inflammation  Recommendations Use cold therapy such as ice water 20 minutes 2-3 times daily the next few days Begin over the counter Aleve or Ibuprofen twice daily for the next 4-6 days for pain and inflammation You can alternate with tylenol for pain if desired Use relative rest this week Use stretching daily and the leg press exercise I showed you If worse again or causing ongoing problems over the next month, then call or recheck

## 2023-07-12 ENCOUNTER — Other Ambulatory Visit: Payer: Self-pay | Admitting: Medical

## 2023-07-12 DIAGNOSIS — I10 Essential (primary) hypertension: Secondary | ICD-10-CM

## 2023-11-29 ENCOUNTER — Encounter: Payer: Self-pay | Admitting: Medical

## 2023-11-29 ENCOUNTER — Ambulatory Visit: Payer: BC Managed Care – PPO | Admitting: Medical

## 2023-11-29 VITALS — BP 134/76 | HR 69 | Ht 65.25 in | Wt 172.0 lb

## 2023-11-29 DIAGNOSIS — Z136 Encounter for screening for cardiovascular disorders: Secondary | ICD-10-CM

## 2023-11-29 DIAGNOSIS — Z0000 Encounter for general adult medical examination without abnormal findings: Secondary | ICD-10-CM | POA: Diagnosis not present

## 2023-11-29 DIAGNOSIS — Z125 Encounter for screening for malignant neoplasm of prostate: Secondary | ICD-10-CM

## 2023-11-29 DIAGNOSIS — B353 Tinea pedis: Secondary | ICD-10-CM | POA: Insufficient documentation

## 2023-11-29 DIAGNOSIS — E1165 Type 2 diabetes mellitus with hyperglycemia: Secondary | ICD-10-CM | POA: Diagnosis not present

## 2023-11-29 DIAGNOSIS — Z1389 Encounter for screening for other disorder: Secondary | ICD-10-CM

## 2023-11-29 DIAGNOSIS — Z7185 Encounter for immunization safety counseling: Secondary | ICD-10-CM

## 2023-11-29 DIAGNOSIS — Z13220 Encounter for screening for lipoid disorders: Secondary | ICD-10-CM

## 2023-11-29 DIAGNOSIS — J3489 Other specified disorders of nose and nasal sinuses: Secondary | ICD-10-CM | POA: Diagnosis not present

## 2023-11-29 DIAGNOSIS — Z1211 Encounter for screening for malignant neoplasm of colon: Secondary | ICD-10-CM | POA: Insufficient documentation

## 2023-11-29 DIAGNOSIS — I10 Essential (primary) hypertension: Secondary | ICD-10-CM | POA: Diagnosis not present

## 2023-11-29 DIAGNOSIS — B351 Tinea unguium: Secondary | ICD-10-CM | POA: Insufficient documentation

## 2023-11-29 DIAGNOSIS — Z23 Encounter for immunization: Secondary | ICD-10-CM | POA: Diagnosis not present

## 2023-11-29 LAB — MICROSCOPIC EXAMINATION

## 2023-11-29 LAB — LIPID PANEL

## 2023-11-29 MED ORDER — GUAIFENESIN ER 600 MG PO TB12: 600.0000 mg | ORAL_TABLET | Freq: Two times a day (BID) | ORAL | 0 refills | Status: DC | PRN

## 2023-11-29 MED ORDER — TERBINAFINE HCL 1 % EX CREA: 1.0000 | TOPICAL_CREAM | Freq: Two times a day (BID) | CUTANEOUS | 0 refills | Status: DC

## 2023-11-29 NOTE — Progress Notes (Signed)
 Subjective:   HPI  Theodore Rodriguez is a 58 y.o. male who presents for Chief Complaint  Patient presents with   Annual Exam    Fasting cpe, cough for a couple months- worse in the morning, no other concerns. No update to medical or surgey history. Due for colonoscopy this year 04/2024. Would like 2nd dose of shingles    Patient Care Team: Keyani Rigdon, Alm RAMAN, PA-C as PCP - General (Family Medicine) Dr. Suzen Brass, GI [facility] works Needs eye doctor  Concerns: He notes some months of sinus drainage and cough.  No wheezing, no SOB.  Not using anything for this.    Compliant with BP medication.   No glucometer or BP cuff at home.  Feeling fine otherwise.    Reviewed their medical, surgical, family, social, medication, and allergy history and updated chart as appropriate.  No Known Allergies  Past Medical History:  Diagnosis Date   Diabetes mellitus without complication (HCC)    Hypertension     Current Outpatient Medications on File Prior to Visit  Medication Sig Dispense Refill   valsartan -hydrochlorothiazide  (DIOVAN -HCT) 320-25 MG tablet TAKE 1 TABLET BY MOUTH EVERY DAY 90 tablet 1   No current facility-administered medications on file prior to visit.      Current Outpatient Medications:    guaiFENesin (MUCINEX) 600 MG 12 hr tablet, Take 1 tablet (600 mg total) by mouth 2 (two) times daily as needed., Disp: 20 tablet, Rfl: 0   terbinafine (LAMISIL AT) 1 % cream, Apply 1 Application topically 2 (two) times daily., Disp: 30 g, Rfl: 0   valsartan -hydrochlorothiazide  (DIOVAN -HCT) 320-25 MG tablet, TAKE 1 TABLET BY MOUTH EVERY DAY, Disp: 90 tablet, Rfl: 1  Family History  Problem Relation Age of Onset   Hypertension Mother    Kidney disease Father    Cancer Father        liver   Heart disease Sister        congenital   Diabetes Maternal Aunt    Colon cancer Maternal Aunt    Colon cancer Maternal Uncle    Esophageal cancer Neg Hx    Stomach cancer Neg Hx     Rectal cancer Neg Hx     Past Surgical History:  Procedure Laterality Date   COLONOSCOPY  04/2018   multiple polyps, repeat 2024; Dr. Suzen Brass    Review of Systems  Constitutional:  Negative for chills, fever, malaise/fatigue and weight loss.  HENT:  Positive for congestion. Negative for ear pain, hearing loss, sore throat and tinnitus.   Eyes:  Negative for blurred vision, pain and redness.  Respiratory:  Positive for cough. Negative for hemoptysis and shortness of breath.   Cardiovascular:  Negative for chest pain, palpitations, orthopnea, claudication and leg swelling.  Gastrointestinal:  Negative for abdominal pain, blood in stool, constipation, diarrhea, nausea and vomiting.  Genitourinary:  Negative for dysuria, flank pain, frequency, hematuria and urgency.  Musculoskeletal:  Negative for falls, joint pain and myalgias.  Skin:  Negative for itching and rash.  Neurological:  Negative for dizziness, tingling, speech change, weakness and headaches.  Endo/Heme/Allergies:  Negative for polydipsia. Does not bruise/bleed easily.  Psychiatric/Behavioral:  Negative for depression and memory loss. The patient is not nervous/anxious and does not have insomnia.       Objective:  BP 134/76   Pulse 69   Ht 5' 5.25 (1.657 m)   Wt 172 lb (78 kg)   SpO2 97%   BMI 28.40 kg/m   BP Readings from  Last 3 Encounters:  11/29/23 134/76  04/02/23 120/82  11/21/22 128/76   Wt Readings from Last 3 Encounters:  11/29/23 172 lb (78 kg)  04/02/23 167 lb 12.8 oz (76.1 kg)  11/21/22 168 lb 6.4 oz (76.4 kg)    General appearance: alert, no distress, WD/WN, African American male Skin: Whitish macerated coloration and some cracking between toes suggestive of tinea pedis bilaterally, thickened hypertrophic and discolored toenails throughout consistent with onychomycosis, otherwise skin unremarkable HEENT: normocephalic, conjunctiva/corneas normal, sclerae anicteric, PERRLA, EOMi, nares  patent, no discharge or erythema, pharynx normal Oral cavity: MMM, tongue normal, teeth in good repair Neck: supple, no lymphadenopathy, no thyromegaly, no masses, normal ROM, no bruits Chest: non tender, normal shape and expansion Heart: RRR, normal S1, S2, no murmurs Lungs: CTA bilaterally, no wheezes, rhonchi, or rales Abdomen: +bs, soft, non tender, non distended, no masses, no hepatomegaly, no splenomegaly, no bruits Back: non tender, normal ROM, no scoliosis Musculoskeletal: upper extremities non tender, no obvious deformity, normal ROM throughout, lower extremities non tender, no obvious deformity, normal ROM throughout Extremities: no edema, no cyanosis, no clubbing Pulses: 2+ symmetric, upper and lower extremities, normal cap refill Neurological: alert, oriented x 3, CN2-12 intact, strength normal upper extremities and lower extremities, sensation normal throughout, DTRs 2+ throughout, no cerebellar signs, gait normal Psychiatric: normal affect, behavior normal, pleasant  GU: declined Rectal: declined    Assessment and Plan :   Encounter Diagnoses  Name Primary?   Encounter for health maintenance examination in adult Yes   Need for shingles vaccine    Screening for colon cancer    Vaccine counseling    Screening for prostate cancer    Screening for hematuria or proteinuria    Essential hypertension    Encounter for lipid screening for cardiovascular disease    Type 2 diabetes mellitus with hyperglycemia, without long-term current use of insulin (HCC)    Sinus drainage    Tinea pedis of both feet    Onychomycosis     This visit was a preventative care visit, also known as wellness visit or routine physical.   Topics typically include healthy lifestyle, diet, exercise, preventative care, vaccinations, sick and well care, proper use of emergency dept and after hours care, as well as other concerns.     Separate significant issues discussed: Diabetes type 2 -we discussed  findings last year.  Not currently on medication.  Pending labs will likely have you start checking blood sugars and potentially pursue medication.  Get a yearly diabetic eye exam.  Check feet daily for sores or wounds.  Follow-up at least twice a year here for recheck.  HTN -continue valsartan  HCT 320/25 mg daily.  Monitor blood pressures at home, pressures are To do this.  Goal blood pressure less than 130/80   cough, sinus drainage -begin Mucinex and nasal saline flush daily for the next 1 to 2 weeks.  If cough continues or does not resolve then recheck  Tinea pedis -begin Lamisil cream at nighttime particular in between the toes.  If labs are okay we might begin oral Lamisil daily for 1 to 2 months for onychomycosis   General Recommendations: Continue to return yearly for your annual wellness and preventative care visits.  This gives us  a chance to discuss healthy lifestyle, exercise, vaccinations, review your chart record, and perform screenings where appropriate.  I recommend you see your eye doctor yearly for routine vision care.  I recommend you see your dentist yearly for routine dental care  including hygiene visits twice yearly.   Vaccination  Immunization History  Administered Date(s) Administered   Influenza Split 03/12/2013, 03/27/2023   Influenza,inj,Quad PF,6+ Mos 05/09/2017, 04/16/2018, 01/28/2021   Tdap 01/17/2015, 12/11/2022   Zoster Recombinant(Shingrix ) 11/21/2022, 11/29/2023    Vaccine recommendations: Shingrix  Prevnar Yearly flu shot   Vaccines administered today: 2nd shingrix  today.  Return at your convenience for Prevnar 20 vaccine.   Screening for cancer: Colon cancer screening: Due 04/2023, updated referral today for colonoscopy.  Not sure what happened with follow up with GI last year.    Prior or last colon cancer screen: 2019   Prostate Cancer screening: The recommended prostate cancer screening test is a blood test called the  prostate-specific antigen (PSA) test. PSA is a protein that is made in the prostate. As you age, your prostate naturally produces more PSA. Abnormally high PSA levels may be caused by: Prostate cancer. An enlarged prostate that is not caused by cancer (benign prostatic hyperplasia, or BPH). This condition is very common in older men. A prostate gland infection (prostatitis) or urinary tract infection. Certain medicines such as male hormones (like testosterone) or other medicines that raise testosterone levels. A rectal exam may be done as part of prostate cancer screening to help provide information about the size of your prostate gland. When a rectal exam is performed, it should be done after the PSA level is drawn to avoid any effect on the results.   Skin cancer screening: Check your skin regularly for new changes, growing lesions, or other lesions of concern Come in for evaluation if you have skin lesions of concern.   Lung cancer screening: If you have a greater than 20 pack year history of tobacco use, then you may qualify for lung cancer screening with a chest CT scan.   Please call your insurance company to inquire about coverage for this test.   Pancreatic cancer:  no current screening test is available or routinely recommended. (risk factors: smoking, overweight or obese, diabetes, chronic pancreatitis, work exposure - dry cleaning, metal working, 58yo>, M>F, Tree surgeon, family hx/o, hereditary breast, ovarian, melanoma, lynch, peutz-jeghers).  Symptoms: jaundice, dark urine, light color or greasy stools, itchy skin, belly or back pain, weight loss, poor appetite, nausea, vomiting, liver enlargement, DVT/blood clots.   We currently don't have screenings for other cancers besides breast, cervical, colon, and lung cancers.  If you have a strong family history of cancer or have other cancer screening concerns, please let me know.  Genetic testing referral is an option for individuals  with high cancer risk in the family.  There are some other cancer screenings in development currently.   Bone health: Get at least 150 minutes of aerobic exercise weekly Get weight bearing exercise at least once weekly Bone density test:  A bone density test is an imaging test that uses a type of X-ray to measure the amount of calcium and other minerals in your bones. The test may be used to diagnose or screen you for a condition that causes weak or thin bones (osteoporosis), predict your risk for a broken bone (fracture), or determine how well your osteoporosis treatment is working. The bone density test is recommended for females 65 and older, or females or males <65 if certain risk factors such as thyroid disease, long term use of steroids such as for asthma or rheumatological issues, vitamin D deficiency, estrogen deficiency, family history of osteoporosis, self or family history of fragility fracture in first degree relative.  Heart health: Get at least 150 minutes of aerobic exercise weekly Limit alcohol It is important to maintain a healthy blood pressure and healthy cholesterol numbers  Heart disease screening: Screening for heart disease includes screening for blood pressure, fasting lipids, glucose/diabetes screening, BMI height to weight ratio, reviewed of smoking status, physical activity, and diet.    Goals include blood pressure 120/80 or less, maintaining a healthy lipid/cholesterol profile, preventing diabetes or keeping diabetes numbers under good control, not smoking or using tobacco products, exercising most days per week or at least 150 minutes per week of exercise, and eating healthy variety of fruits and vegetables, healthy oils, and avoiding unhealthy food choices like fried food, fast food, high sugar and high cholesterol foods.    Other tests may possibly include EKG test, CT coronary calcium score, echocardiogram, exercise treadmill stress test.      Vascular  disease screening: For higher risk individuals including smokers, diabetics, patients with known heart disease or high blood pressure, kidney disease, and others, screening for vascular disease or atherosclerosis of the arteries is available.  Examples may include carotid ultrasound, abdominal aortic ultrasound, ABI blood flow screening in the legs, thoracic aorta screening.    Medical care options: I recommend you continue to seek care here first for routine care.  We try really hard to have available appointments Monday through Friday daytime hours for sick visits, acute visits, and physicals.  Urgent care should be used for after hours and weekends for significant issues that cannot wait till the next day.  The emergency department should be used for significant potentially life-threatening emergencies.  The emergency department is expensive, can often have long wait times for less significant concerns, so try to utilize primary care, urgent care, or telemedicine when possible to avoid unnecessary trips to the emergency department.  Virtual visits and telemedicine have been introduced since the pandemic started in 2020, and can be convenient ways to receive medical care.  We offer virtual appointments as well to assist you in a variety of options to seek medical care.   Legal  Take the time to do a last will and testament, Advanced Directives including Health Care Power of Attorney and Living Will documents.  Don't leave your family with burdens that can be handled ahead of time.   Advanced Directives: I recommend you consider completing a Health Care Power of Attorney and Living Will.   These documents respect your wishes and help alleviate burdens on your loved ones if you were to become terminally ill or be in a position to need those documents enforced.    You can complete Advanced Directives yourself, have them notarized, then have copies made for our office, for you and for anybody you feel  should have them in safe keeping.  Or, you can have an attorney prepare these documents.   If you haven't updated your Last Will and Testament in a while, it may be worthwhile having an attorney prepare these documents together and save on some costs.       Spiritual and Emotional Health Keeping a healthy spiritual life can help you better manage your physical health. Your spiritual life can help you to cope with any issues that may arise with your physical health.  Balance can keep us  healthy and help us  to recover.  If you are struggling with your spiritual health there are questions that you may want to ask yourself:  What makes me feel most complete? When do I feel most  connected to the rest of the world? Where do I find the most inner strength? What am I doing when I feel whole?  Helpful tips: Being in nature. Some people feel very connected and at peace when they are walking outdoors or are outside. Helping others. Some feel the largest sense of wellbeing when they are of service to others. Being of service can take on many forms. It can be doing volunteer work, being kind to strangers, or offering a hand to a friend in need. Gratitude. Some people find they feel the most connected when they remain grateful. They may make lists of all the things they are grateful for or say a thank you out loud for all they have.    Emotional Health Are you in tune with your emotional health?  Check out this link: http://www.marquez-love.com/    Financial Health Make sure you use a budget for your personal finances Make sure you are insured against risks (health insurance, life insurance, auto insurance, etc) Save more, spend less Set financial goals If you need help in this area, good resources include counseling through [employer] or other community resources, have a meeting with a Social research officer, government, and a good resource is the [employer]    Baptiste was seen  today for annual exam.  Diagnoses and all orders for this visit:  Encounter for health maintenance examination in adult -     Comprehensive metabolic panel with GFR -     CBC -     Lipid panel -     TSH -     PSA -     Hemoglobin A1c -     Microalbumin/Creatinine Ratio, Urine -     Urinalysis, Routine w reflex microscopic  Need for shingles vaccine -     Zoster, Recombinant (Shingrix )  Screening for colon cancer -     Ambulatory referral to Gastroenterology  Vaccine counseling  Screening for prostate cancer -     PSA  Screening for hematuria or proteinuria -     Urinalysis, Routine w reflex microscopic  Essential hypertension  Encounter for lipid screening for cardiovascular disease -     Lipid panel  Type 2 diabetes mellitus with hyperglycemia, without long-term current use of insulin (HCC) -     Hemoglobin A1c -     Microalbumin/Creatinine Ratio, Urine  Sinus drainage  Tinea pedis of both feet  Onychomycosis  Other orders -     terbinafine (LAMISIL AT) 1 % cream; Apply 1 Application topically 2 (two) times daily. -     guaiFENesin (MUCINEX) 600 MG 12 hr tablet; Take 1 tablet (600 mg total) by mouth 2 (two) times daily as needed.     Follow-up pending labs, yearly for physical

## 2023-12-01 LAB — LIPID PANEL
Chol/HDL Ratio: 3.2 ratio (ref 0.0–5.0)
Cholesterol, Total: 185 mg/dL (ref 100–199)
HDL: 58 mg/dL (ref >39)
LDL Chol Calc (NIH): 111 mg/dL — ABNORMAL HIGH (ref 0–99)
Triglycerides: 85 mg/dL (ref 0–149)
VLDL Cholesterol Cal: 16 mg/dL (ref 5–40)

## 2023-12-01 LAB — URINALYSIS, ROUTINE W REFLEX MICROSCOPIC
Bilirubin, UA: NEGATIVE
Glucose, UA: NEGATIVE
Ketones, UA: NEGATIVE
Nitrite, UA: NEGATIVE
Protein,UA: NEGATIVE
RBC, UA: NEGATIVE
Specific Gravity, UA: 1.02 (ref 1.005–1.030)
Urobilinogen, Ur: 0.2 mg/dL (ref 0.2–1.0)
pH, UA: 6 (ref 5.0–7.5)

## 2023-12-01 LAB — COMPREHENSIVE METABOLIC PANEL WITH GFR
ALT: 32 IU/L (ref 0–44)
AST: 25 IU/L (ref 0–40)
Albumin: 4.1 g/dL (ref 3.8–4.9)
Alkaline Phosphatase: 83 IU/L (ref 44–121)
BUN/Creatinine Ratio: 13 (ref 9–20)
BUN: 13 mg/dL (ref 6–24)
Bilirubin Total: 1 mg/dL (ref 0.0–1.2)
CO2: 23 mmol/L (ref 20–29)
Calcium: 9.7 mg/dL (ref 8.7–10.2)
Chloride: 98 mmol/L (ref 96–106)
Creatinine, Ser: 1.04 mg/dL (ref 0.76–1.27)
Globulin, Total: 2.5 g/dL (ref 1.5–4.5)
Glucose: 97 mg/dL (ref 70–99)
Potassium: 4.4 mmol/L (ref 3.5–5.2)
Sodium: 138 mmol/L (ref 134–144)
Total Protein: 6.6 g/dL (ref 6.0–8.5)
eGFR: 83 mL/min/1.73 (ref >59)

## 2023-12-01 LAB — CBC
Hematocrit: 46.7 % (ref 37.5–51.0)
Hemoglobin: 14.8 g/dL (ref 13.0–17.7)
MCH: 27.9 pg (ref 26.6–33.0)
MCHC: 31.7 g/dL (ref 31.5–35.7)
MCV: 88 fL (ref 79–97)
Platelets: 229 x10E3/uL (ref 150–450)
RBC: 5.31 x10E6/uL (ref 4.14–5.80)
RDW: 14.5 % (ref 11.6–15.4)
WBC: 5 x10E3/uL (ref 3.4–10.8)

## 2023-12-01 LAB — MICROSCOPIC EXAMINATION
Bacteria, UA: NONE SEEN
Casts: NONE SEEN /LPF
RBC, Urine: NONE SEEN /HPF (ref 0–2)

## 2023-12-01 LAB — MICROALBUMIN / CREATININE URINE RATIO
Creatinine, Urine: 203.6 mg/dL
Microalb/Creat Ratio: 2 mg/g{creat} (ref 0–29)
Microalbumin, Urine: 4.2 ug/mL

## 2023-12-01 LAB — HEMOGLOBIN A1C
Est. average glucose Bld gHb Est-mCnc: 140 mg/dL
Hgb A1c MFr Bld: 6.5 % — ABNORMAL HIGH (ref 4.8–5.6)

## 2023-12-01 LAB — PSA: Prostate Specific Ag, Serum: 2 ng/mL (ref 0.0–4.0)

## 2023-12-01 LAB — TSH: TSH: 0.859 u[IU]/mL (ref 0.450–4.500)

## 2023-12-02 ENCOUNTER — Other Ambulatory Visit: Payer: Self-pay | Admitting: Medical

## 2023-12-02 ENCOUNTER — Ambulatory Visit: Payer: Self-pay | Admitting: Medical

## 2023-12-02 DIAGNOSIS — I10 Essential (primary) hypertension: Secondary | ICD-10-CM

## 2023-12-02 MED ORDER — VALSARTAN-HYDROCHLOROTHIAZIDE 320-25 MG PO TABS: 1.0000 | ORAL_TABLET | Freq: Every day | ORAL | 2 refills | Status: AC

## 2023-12-02 MED ORDER — ROSUVASTATIN CALCIUM 10 MG PO TABS: 10.0000 mg | ORAL_TABLET | ORAL | 2 refills | Status: AC

## 2023-12-02 NOTE — Progress Notes (Signed)
 Results through my chart

## 2023-12-02 NOTE — Progress Notes (Signed)
Results sent through mychart 

## 2023-12-03 ENCOUNTER — Other Ambulatory Visit: Payer: Self-pay | Admitting: Medical

## 2024-01-15 ENCOUNTER — Encounter: Payer: Self-pay | Admitting: Medical

## 2024-01-15 ENCOUNTER — Ambulatory Visit: Admitting: Medical

## 2024-01-15 VITALS — BP 138/82 | HR 78 | Ht 65.0 in | Wt 171.0 lb

## 2024-01-15 DIAGNOSIS — Z299 Encounter for prophylactic measures, unspecified: Secondary | ICD-10-CM

## 2024-01-15 DIAGNOSIS — E1165 Type 2 diabetes mellitus with hyperglycemia: Secondary | ICD-10-CM

## 2024-01-15 DIAGNOSIS — N529 Male erectile dysfunction, unspecified: Secondary | ICD-10-CM

## 2024-01-15 DIAGNOSIS — I10 Essential (primary) hypertension: Secondary | ICD-10-CM | POA: Diagnosis not present

## 2024-01-15 MED ORDER — TERBINAFINE HCL 250 MG PO TABS: 250.0000 mg | ORAL_TABLET | Freq: Every day | ORAL | 0 refills | Status: DC

## 2024-01-15 MED ORDER — TERBINAFINE HCL 1 % EX CREA: 1.0000 | TOPICAL_CREAM | Freq: Two times a day (BID) | CUTANEOUS | 0 refills | Status: DC

## 2024-01-15 MED ORDER — SILDENAFIL CITRATE 100 MG PO TABS: 50.0000 mg | ORAL_TABLET | Freq: Every day | ORAL | 2 refills | Status: DC | PRN

## 2024-01-15 NOTE — Progress Notes (Signed)
 Subjective:  Theodore Rodriguez is a 58 y.o. male who presents for Chief Complaint  Patient presents with   Follow-up     Here for medication follow-up  I saw him back in July for a physical  We started Lamisil  oral medication to help with toenail fungus at that time.  Here to recheck on toenail fungus.  No side effects reported of Lamisil  medication.  At his recent visit we had recommended he use Crestor  3 days a week for lipid lowering, heart disease prevention.  He is taking Crestor  10 mg every other day.   No side effects.    Diabetes-diet controlled.  Hypertension-compliant with Diovan  HCT 320/25 mg daily.   Not checking home BP.    Having some problems with erections.  Still gets some erections but not as full as in the past.  No CP, SOB, dyspnea.   No prior use of Viagra  or similar   No other aggravating or relieving factors.    No other c/o.  Past Medical History:  Diagnosis Date   Diabetes mellitus without complication (HCC)    Hypertension    Current Outpatient Medications on File Prior to Visit  Medication Sig Dispense Refill   rosuvastatin  (CRESTOR ) 10 MG tablet Take 1 tablet (10 mg total) by mouth every other day. 90 tablet 2   valsartan -hydrochlorothiazide  (DIOVAN -HCT) 320-25 MG tablet Take 1 tablet by mouth daily. 90 tablet 2   guaiFENesin  (MUCINEX ) 600 MG 12 hr tablet Take 1 tablet (600 mg total) by mouth 2 (two) times daily as needed. (Patient not taking: Reported on 01/15/2024) 20 tablet 0   terbinafine  (LAMISIL  AT) 1 % cream Apply 1 Application topically 2 (two) times daily. (Patient not taking: Reported on 01/15/2024) 30 g 0   No current facility-administered medications on file prior to visit.    The following portions of the patient's history were reviewed and updated as appropriate: allergies, current medications, past family history, past medical history, past social history, past surgical history and problem list.  ROS Otherwise as in subjective  above    Objective: BP 138/82   Pulse 78   Ht 5' 5 (1.651 m)   Wt 171 lb (77.6 kg)   SpO2 98%   BMI 28.46 kg/m   BP Readings from Last 3 Encounters:  01/15/24 138/82  11/29/23 134/76  04/02/23 120/82   Wt Readings from Last 3 Encounters:  01/15/24 171 lb (77.6 kg)  11/29/23 172 lb (78 kg)  04/02/23 167 lb 12.8 oz (76.1 kg)    General appearance: alert, no distress, well developed, well nourished Heart: RRR, normal S1, S2, no murmurs Lungs: CTA bilaterally, no wheezes, rhonchi, or rales Abdomen: +bs, soft, non tender, non distended, no masses, no hepatomegaly, no splenomegaly Pulses: 2+ radial pulses, 2+ pedal pulses, normal cap refill Ext: no edema   Diabetic Foot Exam - Simple   Simple Foot Form Visual Inspection See comments: Yes Sensation Testing Intact to touch and monofilament testing bilaterally: Yes Pulse Check Posterior Tibialis and Dorsalis pulse intact bilaterally: Yes Comments Skin: Whitish macerated coloration and some cracking between toes suggestive of tinea pedis bilaterally, thickened hypertrophic and discolored toenails throughout consistent with onychomycosis, otherwise skin unremarkable      Assessment: Encounter Diagnoses  Name Primary?   Essential hypertension Yes   Type 2 diabetes mellitus with hyperglycemia, without long-term current use of insulin (HCC)    Preventive measure    Erectile dysfunction, unspecified erectile dysfunction type      Plan: Hypertension  Continue valsartan  HCT 320/25 mg daily If possible monitor blood pressures every now and then at home.  Goal is less than 130/80  Exercise regularly.  Most days per week with cardio and weightbearing exercise  Preventative measure-continue rosuvastatin  Crestor  10 mg every other day.  Diabetes-diet controlled  Begin oral Lamisil  and topical Lamisil  for toenail fungus and athletes feet.  Make sure you get the cream in between your toes then on the bottom of the feet.  Use  the cream nightly.  Start the oral Lamisil  and use that daily.  I spoke to your pharmacy.  Your insurance does not cover the cream but it is around $16 cash pay price.  Lets have you schedule for a lab visit in 1 month to check your cholesterol and liver/kidney test since you are going to be on the Lamisil  for the next month.  I would like to see your feet again in about 6 weeks after starting the Lamisil    See your eye doctor soon for routine care and yearly diabetic eye exam.  Make sure the eye doctor sends us  a copy of the notes  Erectile Dysfunction - Reviewed pathophysiology and differential diagnosis of erectile dysfunction with the patient.  Discussed treatment options.  Begin trial of Viagra .  Discussed potential risks of medications including hypotension and priapism.  Discussed proper use of medication.  Questions were answered.  Recheck 2-3 wk   [name] was seen today for follow-up.  Diagnoses and all orders for this visit:  Essential hypertension  Type 2 diabetes mellitus with hyperglycemia, without long-term current use of insulin (HCC)  Preventive measure  Erectile dysfunction, unspecified erectile dysfunction type  Other orders -     terbinafine  (LAMISIL ) 250 MG tablet; Take 1 tablet (250 mg total) by mouth daily. -     terbinafine  (LAMISIL  AT) 1 % cream; Apply 1 Application topically 2 (two) times daily. -     sildenafil  (VIAGRA ) 100 MG tablet; Take 0.5-1 tablets (50-100 mg total) by mouth daily as needed for erectile dysfunction.    Follow up: 53mo for labs

## 2024-01-15 NOTE — Patient Instructions (Signed)
 Hypertension Continue valsartan  HCT 320/25 mg daily If possible monitor blood pressures every now and then at home.  Goal is less than 130/80  Exercise regularly.  Most days per week with cardio and weightbearing exercise  Preventative measure-continue rosuvastatin  Crestor  10 mg every other day.  Diabetes-diet controlled  Begin oral Lamisil  and topical Lamisil  for toenail fungus and athletes feet.  Make sure you get the cream in between your toes then on the bottom of the feet.  Use the cream nightly.  Start the oral Lamisil  and use that daily.  I spoke to your pharmacy.  Your insurance does not cover the cream but it is around $16 cash pay price.  Lets have you schedule for a lab visit in 1 month to check your cholesterol and liver/kidney test since you are going to be on the Lamisil  for the next month.  I would like to see your feet again in about 6 weeks after starting the Lamisil    See your eye doctor soon for routine care and yearly diabetic eye exam.  Make sure the eye doctor sends us  a copy of the notes

## 2024-02-11 ENCOUNTER — Ambulatory Visit (INDEPENDENT_AMBULATORY_CARE_PROVIDER_SITE_OTHER): Admitting: Medical

## 2024-02-11 ENCOUNTER — Other Ambulatory Visit: Payer: Self-pay | Admitting: Medical

## 2024-02-11 ENCOUNTER — Other Ambulatory Visit

## 2024-02-11 VITALS — BP 122/70 | HR 92 | Wt 167.2 lb

## 2024-02-11 DIAGNOSIS — E785 Hyperlipidemia, unspecified: Secondary | ICD-10-CM

## 2024-02-11 DIAGNOSIS — B351 Tinea unguium: Secondary | ICD-10-CM

## 2024-02-11 DIAGNOSIS — B353 Tinea pedis: Secondary | ICD-10-CM

## 2024-02-11 DIAGNOSIS — E1165 Type 2 diabetes mellitus with hyperglycemia: Secondary | ICD-10-CM | POA: Diagnosis not present

## 2024-02-11 DIAGNOSIS — N529 Male erectile dysfunction, unspecified: Secondary | ICD-10-CM

## 2024-02-11 DIAGNOSIS — I10 Essential (primary) hypertension: Secondary | ICD-10-CM | POA: Diagnosis not present

## 2024-02-11 DIAGNOSIS — L602 Onychogryphosis: Secondary | ICD-10-CM

## 2024-02-11 MED ORDER — TERBINAFINE HCL 250 MG PO TABS: 250.0000 mg | ORAL_TABLET | Freq: Every day | ORAL | 0 refills | Status: DC

## 2024-02-11 MED ORDER — TERBINAFINE HCL 1 % EX CREA: 1.0000 | TOPICAL_CREAM | Freq: Two times a day (BID) | CUTANEOUS | 0 refills | Status: AC

## 2024-02-11 MED ORDER — SILDENAFIL CITRATE 100 MG PO TABS: 50.0000 mg | ORAL_TABLET | Freq: Every day | ORAL | 5 refills | Status: AC | PRN

## 2024-02-11 NOTE — Progress Notes (Signed)
 Name: Theodore Rodriguez   Date of Visit: 02/11/24   Date of last visit with me: 02/11/2024   CHIEF COMPLAINT:  Chief Complaint  Patient presents with   Acute Visit    Discuss medication and labs       HPI:  Discussed the use of AI scribe software for clinical note transcription with the patient, who gave verbal consent to proceed.  History of Present Illness    Theodore Rodriguez is a 58 year old male who presents for follow-up and lab work.  He has been on oral Lamisil  for approximately one month to treat toenail fungus, which began two years ago with discoloration and thickening of the nails. He uses a cream nightly, washing and drying his feet  before application. He has a few pills left and has been compliant with the treatment regimen.  He has been using Lamisil  cream as well for athletes feet.  Doing better  He was started on cholesterol medication in July due to elevated cholesterol levels. He reports no side effects from the medication and has not been actively trying to lose weight, although he has lost four pounds since his last visit, going from 171 to 167 pounds.  He is on valsartan  HCT 320/25 mg for blood pressure, which he takes daily.  He has not yet seen an eye doctor for a diabetic eye exam and needs to make an appointment.  He is using Viagra , taking half a pill as needed, which he reports works well for him.  No other aggravating or relieving factors. No other complaint.  Past Medical History:  Diagnosis Date   Diabetes mellitus without complication (HCC)    Hypertension    Current Outpatient Medications on File Prior to Visit  Medication Sig Dispense Refill   rosuvastatin  (CRESTOR ) 10 MG tablet Take 1 tablet (10 mg total) by mouth every other day. 90 tablet 2   valsartan -hydrochlorothiazide  (DIOVAN -HCT) 320-25 MG tablet Take 1 tablet by mouth daily. 90 tablet 2   No current facility-administered medications on file prior to visit.    ROS as in  subjective   Objective: BP 122/70   Pulse 92   Wt 167 lb 3.2 oz (75.8 kg)   BMI 27.82 kg/m   Gen: wd, wn, nad There is slight improvement of the great toenails with slight pinkish hue nail coming in at the base, otherwise thickened hypertrophic great toenails     Assessment: Encounter Diagnoses  Name Primary?   Onychomycosis Yes   Essential hypertension    Type 2 diabetes mellitus with hyperglycemia, without long-term current use of insulin (HCC)    Hyperlipidemia, unspecified hyperlipidemia type    Erectile dysfunction, unspecified erectile dysfunction type    Hypertrophic toenail    Tinea pedis of both feet      Plan: Onychomycosis Chronic onychomycosis with improvement in nail appearance. Continued treatment necessary. Monitoring required for Lamisil  adverse effects. - Continue oral Lamisil  for 30 days, contingent on normal liver function tests. - Order liver and kidney function tests. - Discontinue antifungal cream. - Refer to podiatrist Theodore Rodriguez for nail care.  Type 2 diabetes mellitus A1c level of 6.5% on 12/02/2023.  Currently diet controlled No recent diabetic retinopathy exam. - Advise scheduling a diabetic eye exam.  Hypertension Continue valsartan  HCT 320/25 mg  Hyperlipidemia Previously elevated cholesterol levels. Continue statin Crestor  10 mg daily - Order lipid panel.  Erectile dysfunction Managed with Viagra , effective at half-tablet dose. Prefers larger quantity than current prescription allows. -  Prescribe Viagra  with quantity of 30 tablets, subject to insurance coverage.  Hypertrophic toenail-referral to podiatry  Theodore Rodriguez was seen today for acute visit.  Diagnoses and all orders for this visit:  Onychomycosis -     Hepatic function panel  Essential hypertension  Type 2 diabetes mellitus with hyperglycemia, without long-term current use of insulin (HCC)  Hyperlipidemia, unspecified hyperlipidemia type -     Lipid  panel  Erectile dysfunction, unspecified erectile dysfunction type  Hypertrophic toenail -     Ambulatory referral to Podiatry  Tinea pedis of both feet  Other orders -     sildenafil  (VIAGRA ) 100 MG tablet; Take 0.5-1 tablets (50-100 mg total) by mouth daily as needed for erectile dysfunction. -     terbinafine  (LAMISIL  AT) 1 % cream; Apply 1 Application topically 2 (two) times daily. -     terbinafine  (LAMISIL ) 250 MG tablet; Take 1 tablet (250 mg total) by mouth daily.    F/u pending labs

## 2024-02-12 ENCOUNTER — Other Ambulatory Visit: Payer: Self-pay | Admitting: Medical

## 2024-02-12 ENCOUNTER — Ambulatory Visit: Payer: Self-pay | Admitting: Medical

## 2024-02-12 LAB — HEPATIC FUNCTION PANEL
ALT: 25 IU/L (ref 0–44)
AST: 23 IU/L (ref 0–40)
Albumin: 4.2 g/dL (ref 3.8–4.9)
Alkaline Phosphatase: 78 IU/L (ref 49–135)
Bilirubin Total: 0.6 mg/dL (ref 0.0–1.2)
Bilirubin, Direct: 0.16 mg/dL (ref 0.00–0.40)
Total Protein: 6.9 g/dL (ref 6.0–8.5)

## 2024-02-12 LAB — LIPID PANEL
Chol/HDL Ratio: 3.2 ratio (ref 0.0–5.0)
Cholesterol, Total: 175 mg/dL (ref 100–199)
HDL: 55 mg/dL (ref >39)
LDL Chol Calc (NIH): 104 mg/dL — ABNORMAL HIGH (ref 0–99)
Triglycerides: 85 mg/dL (ref 0–149)
VLDL Cholesterol Cal: 16 mg/dL (ref 5–40)

## 2024-02-12 NOTE — Progress Notes (Signed)
 Results through MyChart

## 2024-02-27 ENCOUNTER — Ambulatory Visit: Admitting: Podiatry

## 2024-02-27 ENCOUNTER — Encounter: Payer: Self-pay | Admitting: Podiatry

## 2024-02-27 DIAGNOSIS — B351 Tinea unguium: Secondary | ICD-10-CM

## 2024-02-27 DIAGNOSIS — M79674 Pain in right toe(s): Secondary | ICD-10-CM

## 2024-02-27 DIAGNOSIS — M79675 Pain in left toe(s): Secondary | ICD-10-CM | POA: Diagnosis not present

## 2024-02-28 NOTE — Progress Notes (Signed)
 Subjective:   Patient ID: Theodore Rodriguez, male   DOB: 58 y.o.   MRN: [id number]   HPI Patient presents with bothersome nailbeds on both feet stating that he had several removed but there has been some regrowth and he has approximately 7 nails that are bothersome.  Patient does not smoke likes to be active   Review of Systems  All other systems reviewed and are negative.       Objective:  Physical Exam Vitals and nursing note reviewed.  Constitutional:      Appearance: He is well-developed.  Pulmonary:     Effort: Pulmonary effort is normal.  Musculoskeletal:        General: Normal range of motion.  Skin:    General: Skin is warm.  Neurological:     Mental Status: He is alert.     Neurovascular status found to be intact muscle strength was found to be adequate range of motion is adequate with deformed nailbeds 3 more 5 bilateral with hypertrophy of the nailbed hallux bilateral with moderate discomfort     Assessment:  Chronic mycotic nail infection bilateral history of nail removal which was effective for a number of nails with pain noted upon nailbeds     Plan:  H&P reviewed at this point sterile debridement of nailbeds accomplished no iatrogenic bleeding smoothing of the nails with Dremel and did discuss other treatments if symptoms should worsen

## 2024-03-13 ENCOUNTER — Other Ambulatory Visit: Payer: Self-pay | Admitting: Medical

## 2024-03-31 ENCOUNTER — Encounter: Payer: Self-pay | Admitting: Medical

## 2024-04-11 ENCOUNTER — Encounter: Payer: Self-pay | Admitting: Gastroenterology

## 2024-05-19 ENCOUNTER — Telehealth: Payer: Self-pay

## 2024-05-19 NOTE — Telephone Encounter (Signed)
 Patient had in-person pre-visit scheduled for 4:00 on 05/19/24. RN waited until 4:30 for patient to arrive, but he did not come. RN printed and mailed letter to patient notifying him that because of missing his pre-visit appointment, his colonoscopy has been cancelled.

## 2024-05-21 ENCOUNTER — Ambulatory Visit: Payer: Self-pay

## 2024-05-21 VITALS — Ht 65.0 in | Wt 166.6 lb

## 2024-05-21 DIAGNOSIS — Z860100 Personal history of colon polyps, unspecified: Secondary | ICD-10-CM

## 2024-05-21 MED ORDER — NA SULFATE-K SULFATE-MG SULF 17.5-3.13-1.6 GM/177ML PO SOLN: 1.0000 | Freq: Once | ORAL | 0 refills | Status: AC

## 2024-05-21 NOTE — Progress Notes (Signed)

## 2024-06-06 ENCOUNTER — Encounter: Admitting: Gastroenterology

## 2024-06-25 ENCOUNTER — Encounter: Payer: Self-pay | Admitting: Gastroenterology

## 2024-07-11 ENCOUNTER — Encounter: Admitting: Gastroenterology

## 2024-12-01 ENCOUNTER — Encounter: Payer: Self-pay | Admitting: Medical
